# Patient Record
Sex: Female | Born: 1950
Health system: Southern US, Community
[De-identification: ages and names within clinical notes are randomized; demographics above are authoritative.]

---

## 1998-03-10 ENCOUNTER — Ambulatory Visit (HOSPITAL_COMMUNITY): Admission: RE | Admit: 1998-03-10 | Discharge: 1998-03-10 | Payer: Self-pay | Admitting: General Surgery

## 1998-09-11 ENCOUNTER — Ambulatory Visit (HOSPITAL_COMMUNITY): Admission: RE | Admit: 1998-09-11 | Discharge: 1998-09-11 | Payer: Self-pay | Admitting: General Surgery

## 1998-09-29 ENCOUNTER — Encounter (HOSPITAL_BASED_OUTPATIENT_CLINIC_OR_DEPARTMENT_OTHER): Payer: Self-pay | Admitting: General Surgery

## 1998-09-29 ENCOUNTER — Ambulatory Visit (HOSPITAL_COMMUNITY): Admission: RE | Admit: 1998-09-29 | Discharge: 1998-09-29 | Payer: Self-pay | Admitting: General Surgery

## 1999-10-08 ENCOUNTER — Encounter: Payer: Self-pay | Admitting: Family Medicine

## 1999-10-08 ENCOUNTER — Ambulatory Visit (HOSPITAL_COMMUNITY): Admission: RE | Admit: 1999-10-08 | Discharge: 1999-10-08 | Payer: Self-pay | Admitting: Family Medicine

## 1999-10-08 ENCOUNTER — Encounter (HOSPITAL_BASED_OUTPATIENT_CLINIC_OR_DEPARTMENT_OTHER): Payer: Self-pay | Admitting: General Surgery

## 1999-10-08 ENCOUNTER — Ambulatory Visit (HOSPITAL_COMMUNITY): Admission: RE | Admit: 1999-10-08 | Discharge: 1999-10-08 | Payer: Self-pay | Admitting: General Surgery

## 1999-11-04 ENCOUNTER — Other Ambulatory Visit: Admission: RE | Admit: 1999-11-04 | Discharge: 1999-11-04 | Payer: Self-pay | Admitting: Obstetrics and Gynecology

## 1999-11-04 ENCOUNTER — Encounter (INDEPENDENT_AMBULATORY_CARE_PROVIDER_SITE_OTHER): Payer: Self-pay

## 1999-12-31 ENCOUNTER — Ambulatory Visit (HOSPITAL_COMMUNITY): Admission: RE | Admit: 1999-12-31 | Discharge: 1999-12-31 | Payer: Self-pay | Admitting: Obstetrics and Gynecology

## 1999-12-31 ENCOUNTER — Encounter (INDEPENDENT_AMBULATORY_CARE_PROVIDER_SITE_OTHER): Payer: Self-pay | Admitting: Specialist

## 2001-01-18 ENCOUNTER — Encounter: Payer: Self-pay | Admitting: Obstetrics and Gynecology

## 2001-01-18 ENCOUNTER — Ambulatory Visit (HOSPITAL_COMMUNITY): Admission: RE | Admit: 2001-01-18 | Discharge: 2001-01-18 | Payer: Self-pay | Admitting: Obstetrics and Gynecology

## 2001-02-07 ENCOUNTER — Other Ambulatory Visit: Admission: RE | Admit: 2001-02-07 | Discharge: 2001-02-07 | Payer: Self-pay | Admitting: Obstetrics and Gynecology

## 2001-02-08 ENCOUNTER — Other Ambulatory Visit: Admission: RE | Admit: 2001-02-08 | Discharge: 2001-02-08 | Payer: Self-pay | Admitting: Obstetrics and Gynecology

## 2001-02-08 ENCOUNTER — Encounter (INDEPENDENT_AMBULATORY_CARE_PROVIDER_SITE_OTHER): Payer: Self-pay

## 2002-02-02 ENCOUNTER — Ambulatory Visit (HOSPITAL_COMMUNITY): Admission: RE | Admit: 2002-02-02 | Discharge: 2002-02-02 | Payer: Self-pay | Admitting: Obstetrics and Gynecology

## 2002-02-02 ENCOUNTER — Encounter: Payer: Self-pay | Admitting: Obstetrics and Gynecology

## 2002-02-09 ENCOUNTER — Other Ambulatory Visit: Admission: RE | Admit: 2002-02-09 | Discharge: 2002-02-09 | Payer: Self-pay | Admitting: Obstetrics and Gynecology

## 2008-11-18 ENCOUNTER — Ambulatory Visit (HOSPITAL_COMMUNITY): Admission: RE | Admit: 2008-11-18 | Discharge: 2008-11-18 | Payer: Self-pay | Admitting: Family Medicine

## 2019-06-01 DIAGNOSIS — Z0100 Encounter for examination of eyes and vision without abnormal findings: Secondary | ICD-10-CM | POA: Diagnosis not present

## 2019-06-04 DIAGNOSIS — R69 Illness, unspecified: Secondary | ICD-10-CM | POA: Diagnosis not present

## 2019-06-14 DIAGNOSIS — R69 Illness, unspecified: Secondary | ICD-10-CM | POA: Diagnosis not present

## 2019-08-14 DIAGNOSIS — R69 Illness, unspecified: Secondary | ICD-10-CM | POA: Diagnosis not present

## 2020-02-27 DIAGNOSIS — R69 Illness, unspecified: Secondary | ICD-10-CM | POA: Diagnosis not present

## 2020-03-03 DIAGNOSIS — R69 Illness, unspecified: Secondary | ICD-10-CM | POA: Diagnosis not present

## 2020-06-13 DIAGNOSIS — Z0100 Encounter for examination of eyes and vision without abnormal findings: Secondary | ICD-10-CM | POA: Diagnosis not present

## 2020-08-18 ENCOUNTER — Ambulatory Visit (HOSPITAL_COMMUNITY)
Admission: RE | Admit: 2020-08-18 | Discharge: 2020-08-18 | Disposition: A | Discharge status: Home / Self Care | Payer: Medicare HMO | Source: Ambulatory Visit | Attending: Family Medicine | Admitting: Family Medicine

## 2020-08-18 ENCOUNTER — Other Ambulatory Visit (HOSPITAL_COMMUNITY): Payer: Self-pay | Admitting: Family Medicine

## 2020-08-18 ENCOUNTER — Other Ambulatory Visit: Payer: Self-pay

## 2020-08-18 DIAGNOSIS — M479 Spondylosis, unspecified: Secondary | ICD-10-CM | POA: Diagnosis not present

## 2020-08-18 DIAGNOSIS — D510 Vitamin B12 deficiency anemia due to intrinsic factor deficiency: Secondary | ICD-10-CM | POA: Diagnosis not present

## 2020-08-18 DIAGNOSIS — R5383 Other fatigue: Secondary | ICD-10-CM | POA: Diagnosis not present

## 2020-08-18 DIAGNOSIS — E559 Vitamin D deficiency, unspecified: Secondary | ICD-10-CM | POA: Diagnosis not present

## 2020-08-18 DIAGNOSIS — I110 Hypertensive heart disease with heart failure: Secondary | ICD-10-CM | POA: Diagnosis not present

## 2020-08-18 DIAGNOSIS — E7849 Other hyperlipidemia: Secondary | ICD-10-CM | POA: Diagnosis not present

## 2020-08-18 DIAGNOSIS — Z20822 Contact with and (suspected) exposure to covid-19: Secondary | ICD-10-CM | POA: Diagnosis not present

## 2020-08-18 DIAGNOSIS — E1165 Type 2 diabetes mellitus with hyperglycemia: Secondary | ICD-10-CM | POA: Diagnosis not present

## 2020-08-18 DIAGNOSIS — M47812 Spondylosis without myelopathy or radiculopathy, cervical region: Secondary | ICD-10-CM | POA: Diagnosis not present

## 2020-08-23 ENCOUNTER — Ambulatory Visit
Admission: EM | Admit: 2020-08-23 | Discharge: 2020-08-23 | Disposition: A | Discharge status: Home / Self Care | Payer: Medicare HMO | Attending: Emergency Medicine | Admitting: Emergency Medicine

## 2020-08-23 ENCOUNTER — Other Ambulatory Visit: Payer: Self-pay

## 2020-08-23 ENCOUNTER — Encounter: Payer: Self-pay | Admitting: Emergency Medicine

## 2020-08-23 ENCOUNTER — Ambulatory Visit (INDEPENDENT_AMBULATORY_CARE_PROVIDER_SITE_OTHER): Payer: Medicare HMO

## 2020-08-23 DIAGNOSIS — R059 Cough, unspecified: Secondary | ICD-10-CM

## 2020-08-23 DIAGNOSIS — Z20822 Contact with and (suspected) exposure to covid-19: Secondary | ICD-10-CM

## 2020-08-23 DIAGNOSIS — J189 Pneumonia, unspecified organism: Secondary | ICD-10-CM | POA: Diagnosis not present

## 2020-08-23 MED ORDER — AZITHROMYCIN 250 MG PO TABS: 250.0000 mg | ORAL_TABLET | Freq: Every day | ORAL | 0 refills | Status: DC

## 2020-08-23 MED ORDER — ACETAMINOPHEN 325 MG PO TABS
650.0000 mg | ORAL_TABLET | Freq: Once | ORAL | Status: AC
  Administered 2020-08-23: 650 mg via ORAL

## 2020-08-23 MED ORDER — AMOXICILLIN-POT CLAVULANATE 875-125 MG PO TABS: 1.0000 | ORAL_TABLET | Freq: Two times a day (BID) | ORAL | 0 refills | Status: DC

## 2020-08-23 NOTE — ED Provider Notes (Signed)
[facility]    CSN: [id number] Arrival date & time: 08/23/20  0086      History   Chief Complaint Chief Complaint  Patient presents with  . Weakness  . Cough    HPI Sabrina [name] is a 69 y.o. female  Presenting with her daughter for weeklong course of generalized weakness, productive cough, decreased appetite.  Has had low-grade fevers intermittently: Alleviated with Tylenol.  States sputum is clear/white.  No chest pain, shortness of breath, lower leg swelling, nausea, vomiting.  No known sick contacts.  Did do Covid test on day 2 of symptoms: Negative.  History reviewed. No pertinent past medical history.  There are no problems to display for this patient.   History reviewed. No pertinent surgical history.  OB History   No obstetric history on file.      Home Medications    Prior to Admission medications   Medication Sig Start Date End Date Taking? Authorizing Provider  amoxicillin-clavulanate (AUGMENTIN) 875-125 MG tablet Take 1 tablet by mouth every 12 (twelve) hours. 08/23/20   Hall-Potvin, [place], PA-C  azithromycin (ZITHROMAX) 250 MG tablet Take 1 tablet (250 mg total) by mouth daily. Take first 2 tablets together, then 1 every day until finished. 08/23/20   Hall-Potvin, [place], PA-C    Family History Family History  Problem Relation Age of Onset  . Hypertension Mother     Social History Social History   Tobacco Use  . Smoking status: Never Smoker  . Smokeless tobacco: Never Used  Substance Use Topics  . Alcohol use: Not Currently  . Drug use: Never     Allergies   Patient has no known allergies.   Review of Systems As per HPI   Physical Exam Triage Vital Signs ED Triage Vitals  Enc Vitals Group     BP      Pulse      Resp      Temp      Temp src      SpO2      Weight      Height      Head Circumference      Peak Flow      Pain Score      Pain Loc      Pain Edu?      Excl. in GC?    No data  found.  Updated Vital Signs BP 115/65 (BP Location: Left Arm)   Pulse (!) 111   Temp 100.2 F (37.9 C) (Oral)   Resp 18   SpO2 96%   Visual Acuity Right Eye Distance:   Left Eye Distance:   Bilateral Distance:    Right Eye Near:   Left Eye Near:    Bilateral Near:     Physical Exam Constitutional:      General: She is not in acute distress.    Appearance: She is not ill-appearing or diaphoretic.  HENT:     Head: Normocephalic and atraumatic.     Mouth/Throat:     Mouth: Mucous membranes are moist.     Pharynx: Oropharynx is clear. No oropharyngeal exudate or posterior oropharyngeal erythema.  Eyes:     General: No scleral icterus.    Conjunctiva/sclera: Conjunctivae normal.     Pupils: Pupils are equal, round, and reactive to light.  Neck:     Comments: Trachea midline, negative JVD Cardiovascular:     Rate and Rhythm: Normal rate and regular rhythm.     Heart sounds: No murmur  heard.  No gallop.   Pulmonary:     Effort: Pulmonary effort is normal. No respiratory distress.     Breath sounds: No wheezing, rhonchi or rales.  Musculoskeletal:     Cervical back: Neck supple. No tenderness.  Lymphadenopathy:     Cervical: No cervical adenopathy.  Skin:    Capillary Refill: Capillary refill takes less than 2 seconds.     Coloration: Skin is not jaundiced or pale.     Findings: No rash.  Neurological:     General: No focal deficit present.     Mental Status: She is alert and oriented to person, place, and time.      UC Treatments / Results  Labs (all labs ordered are listed, but only abnormal results are displayed) Labs Reviewed  NOVEL CORONAVIRUS, NAA    EKG   Radiology DG Chest 2 View  Result Date: 08/23/2020 CLINICAL DATA:  Cough EXAM: CHEST - 2 VIEW COMPARISON:  None. FINDINGS: Streaky infiltrate at the left lung base. Inferior right hilar fullness which could be branching and vascular but is also abnormally prominent on the lateral view. Preferably,  would allow for resolution of the acute illness before workup imaging. No edema, effusion, or pneumothorax. IMPRESSION: 1. Infiltrate at the left lung base. 2. Unexpected fullness at the right hilum. 3. Followup PA and lateral chest X-ray or CT is recommended in 3-4 weeks following trial of antibiotic therapy to ensure resolution and exclude underlying malignancy. Electronically Signed   By: Marnee Spring M.D.   On: 08/23/2020 09:33    Procedures Procedures (including critical care time)  Medications Ordered in UC Medications  acetaminophen (TYLENOL) tablet 650 mg (650 mg Oral Given 08/23/20 0856)    Initial Impression / Assessment and Plan / UC Course  I have reviewed the triage vital signs and the nursing notes.  Pertinent labs & imaging results that were available during my care of the patient were reviewed by me and considered in my medical decision making (see chart for details).     Cardiopulmonary exam reassuring today, though patient does have low-grade fever with tachycardia and general malaise with decreased appetite.  Chest x-ray done to screen for pneumonia: Significant for infiltrate at left lung base with unexpected fullness at the right hilum.  Will start antibiotics as below, have patient follow-up with PCP in 3 weeks for repeat imaging as recommended by radiology.  Return precautions discussed, pt and daughter verbalized understanding and are agreeable to plan. Final Clinical Impressions(s) / UC Diagnoses   Final diagnoses:  Encounter for screening laboratory testing for COVID-19 virus  Pneumonia of left lower lobe due to infectious organism     Discharge Instructions     Take antibiotics as directed with food. Very important to schedule follow-up with PCP for reevaluation. Go to ER for worsening fatigue/weakness, or you develop high fevers, chest pain, difficulty breathing.    ED Prescriptions    Medication Sig Dispense Auth. Provider   azithromycin  (ZITHROMAX) 250 MG tablet Take 1 tablet (250 mg total) by mouth daily. Take first 2 tablets together, then 1 every day until finished. 6 tablet Hall-Potvin, [place], PA-C   amoxicillin-clavulanate (AUGMENTIN) 875-125 MG tablet Take 1 tablet by mouth every 12 (twelve) hours. 14 tablet Hall-Potvin, [place], PA-C     PDMP not reviewed this encounter.   Odette Fraction [place], [place] 08/23/20 [phone]

## 2020-08-23 NOTE — ED Triage Notes (Signed)
Pt here for generalized weakness, cough and decreased appetite x 1 week; per pt sts low grade fever earlier in week

## 2020-08-23 NOTE — Discharge Instructions (Signed)
Take antibiotics as directed with food. Very important to schedule follow-up with PCP for reevaluation. Go to ER for worsening fatigue/weakness, or you develop high fevers, chest pain, difficulty breathing.

## 2020-08-24 LAB — SARS-COV-2, NAA 2 DAY TAT

## 2020-08-24 LAB — NOVEL CORONAVIRUS, NAA: SARS-CoV-2, NAA: NOT DETECTED

## 2020-09-22 DIAGNOSIS — R69 Illness, unspecified: Secondary | ICD-10-CM | POA: Diagnosis not present

## 2021-01-16 ENCOUNTER — Ambulatory Visit (INDEPENDENT_AMBULATORY_CARE_PROVIDER_SITE_OTHER): Payer: Medicare HMO | Admitting: Family Medicine

## 2021-01-16 ENCOUNTER — Encounter: Payer: Self-pay | Admitting: Family Medicine

## 2021-01-16 ENCOUNTER — Other Ambulatory Visit: Payer: Self-pay

## 2021-01-16 VITALS — BP 172/90 | HR 78 | Temp 98.4°F | Ht 62.0 in | Wt 166.4 lb

## 2021-01-16 DIAGNOSIS — R739 Hyperglycemia, unspecified: Secondary | ICD-10-CM | POA: Diagnosis not present

## 2021-01-16 DIAGNOSIS — R748 Abnormal levels of other serum enzymes: Secondary | ICD-10-CM

## 2021-01-16 DIAGNOSIS — R030 Elevated blood-pressure reading, without diagnosis of hypertension: Secondary | ICD-10-CM

## 2021-01-16 DIAGNOSIS — Z1231 Encounter for screening mammogram for malignant neoplasm of breast: Secondary | ICD-10-CM | POA: Diagnosis not present

## 2021-01-16 DIAGNOSIS — M5030 Other cervical disc degeneration, unspecified cervical region: Secondary | ICD-10-CM

## 2021-01-16 DIAGNOSIS — Z1159 Encounter for screening for other viral diseases: Secondary | ICD-10-CM | POA: Diagnosis not present

## 2021-01-16 LAB — CBC WITH DIFFERENTIAL/PLATELET
Basophils Absolute: 0 10*3/uL (ref 0.0–0.1)
Basophils Relative: 0.3 % (ref 0.0–3.0)
Eosinophils Absolute: 0.1 10*3/uL (ref 0.0–0.7)
Eosinophils Relative: 1.3 % (ref 0.0–5.0)
HCT: 39.8 % (ref 36.0–46.0)
Hemoglobin: 12.7 g/dL (ref 12.0–15.0)
Lymphocytes Relative: 23.8 % (ref 12.0–46.0)
Lymphs Abs: 1 10*3/uL (ref 0.7–4.0)
MCHC: 32 g/dL (ref 30.0–36.0)
MCV: 66.3 fl — ABNORMAL LOW (ref 78.0–100.0)
Monocytes Absolute: 0.3 10*3/uL (ref 0.1–1.0)
Monocytes Relative: 7.4 % (ref 3.0–12.0)
Neutro Abs: 2.7 10*3/uL (ref 1.4–7.7)
Neutrophils Relative %: 67.2 % (ref 43.0–77.0)
Platelets: 234 10*3/uL (ref 150.0–400.0)
RBC: 6 Mil/uL — ABNORMAL HIGH (ref 3.87–5.11)
RDW: 14.9 % (ref 11.5–15.5)
WBC: 4.1 10*3/uL (ref 4.0–10.5)

## 2021-01-16 LAB — COMPREHENSIVE METABOLIC PANEL
ALT: 14 U/L (ref 0–35)
AST: 21 U/L (ref 0–37)
Albumin: 4.4 g/dL (ref 3.5–5.2)
Alkaline Phosphatase: 82 U/L (ref 39–117)
BUN: 12 mg/dL (ref 6–23)
CO2: 31 mEq/L (ref 19–32)
Calcium: 10.2 mg/dL (ref 8.4–10.5)
Chloride: 102 mEq/L (ref 96–112)
Creatinine, Ser: 0.67 mg/dL (ref 0.40–1.20)
GFR: 87.6 mL/min (ref >60.00)
Glucose, Bld: 95 mg/dL (ref 70–99)
Potassium: 5.2 mEq/L — ABNORMAL HIGH (ref 3.5–5.1)
Sodium: 141 mEq/L (ref 135–145)
Total Bilirubin: 0.9 mg/dL (ref 0.2–1.2)
Total Protein: 7.4 g/dL (ref 6.0–8.3)

## 2021-01-16 LAB — HEMOGLOBIN A1C: Hgb A1c MFr Bld: 5.6 % (ref 4.6–6.5)

## 2021-01-16 NOTE — Patient Instructions (Addendum)
-pneumovax 23: look this up. It protects against a pneumonia that has a high mortality rate. Need one and no more.   -rechecking some labs today from the ones you brought in  -ordered mammogram for you, they will call you with this.   -blood pressure is high, start keeping a log and see me back in one month.    -cut out salt, push water and keep a log for me.   So nice to meet you! Dr. Artis Flock   Hypertension, Adult Hypertension is another name for high blood pressure. High blood pressure forces your heart to work harder to pump blood. This can cause problems over time. There are two numbers in a blood pressure reading. There is a top number (systolic) over a bottom number (diastolic). It is best to have a blood pressure that is below 120/80. Healthy choices can help lower your blood pressure, or you may need medicine to help lower it. What are the causes? The cause of this condition is not known. Some conditions may be related to high blood pressure. What increases the risk?  Smoking.  Having type 2 diabetes mellitus, high cholesterol, or both.  Not getting enough exercise or physical activity.  Being overweight.  Having too much fat, sugar, calories, or salt (sodium) in your diet.  Drinking too much alcohol.  Having long-term (chronic) kidney disease.  Having a family history of high blood pressure.  Age. Risk increases with age.  Race. You may be at higher risk if you are African American.  Gender. Men are at higher risk than women before age 71. After age 98, women are at higher risk than men.  Having obstructive sleep apnea.  Stress. What are the signs or symptoms?  High blood pressure may not cause symptoms. Very high blood pressure (hypertensive crisis) may cause: ? Headache. ? Feelings of worry or nervousness (anxiety). ? Shortness of breath. ? Nosebleed. ? A feeling of being sick to your stomach (nausea). ? Throwing up (vomiting). ? Changes in how you  see. ? Very bad chest pain. ? Seizures. How is this treated?  This condition is treated by making healthy lifestyle changes, such as: ? Eating healthy foods. ? Exercising more. ? Drinking less alcohol.  Your health care provider may prescribe medicine if lifestyle changes are not enough to get your blood pressure under control, and if: ? Your top number is above 130. ? Your bottom number is above 80.  Your personal target blood pressure may vary. Follow these instructions at home: Eating and drinking  If told, follow the DASH eating plan. To follow this plan: ? Fill one half of your plate at each meal with fruits and vegetables. ? Fill one fourth of your plate at each meal with whole grains. Whole grains include whole-wheat pasta, brown rice, and whole-grain bread. ? Eat or drink low-fat dairy products, such as skim milk or low-fat yogurt. ? Fill one fourth of your plate at each meal with low-fat (lean) proteins. Low-fat proteins include fish, chicken without skin, eggs, beans, and tofu. ? Avoid fatty meat, cured and processed meat, or chicken with skin. ? Avoid pre-made or processed food.  Eat less than 1,500 mg of salt each day.  Do not drink alcohol if: ? Your doctor tells you not to drink. ? You are pregnant, may be pregnant, or are planning to become pregnant.  If you drink alcohol: ? Limit how much you use to:  0-1 drink a day for women.  0-2 drinks a day for men. ? Be aware of how much alcohol is in your drink. In the [place] one drink equals one 12 oz bottle of beer (355 mL), one 5 oz glass of wine (148 mL), or one 1 oz glass of hard liquor (44 mL).   Lifestyle  Work with your doctor to stay at a healthy weight or to lose weight. Ask your doctor what the best weight is for you.  Get at least 30 minutes of exercise most days of the week. This may include walking, swimming, or biking.  Get at least 30 minutes of exercise that strengthens your muscles (resistance  exercise) at least 3 days a week. This may include lifting weights or doing Pilates.  Do not use any products that contain nicotine or tobacco, such as cigarettes, e-cigarettes, and chewing tobacco. If you need help quitting, ask your doctor.  Check your blood pressure at home as told by your doctor.  Keep all follow-up visits as told by your doctor. This is important.   Medicines  Take over-the-counter and prescription medicines only as told by your doctor. Follow directions carefully.  Do not skip doses of blood pressure medicine. The medicine does not work as well if you skip doses. Skipping doses also puts you at risk for problems.  Ask your doctor about side effects or reactions to medicines that you should watch for. Contact a doctor if you:  Think you are having a reaction to the medicine you are taking.  Have headaches that keep coming back (recurring).  Feel dizzy.  Have swelling in your ankles.  Have trouble with your vision. Get help right away if you:  Get a very bad headache.  Start to feel mixed up (confused).  Feel weak or numb.  Feel faint.  Have very bad pain in your: ? Chest. ? Belly (abdomen).  Throw up more than once.  Have trouble breathing. Summary  Hypertension is another name for high blood pressure.  High blood pressure forces your heart to work harder to pump blood.  For most people, a normal blood pressure is less than 120/80.  Making healthy choices can help lower blood pressure. If your blood pressure does not get lower with healthy choices, you may need to take medicine. This information is not intended to replace advice given to you by your health care provider. Make sure you discuss any questions you have with your health care provider. Document Revised: 06/28/2018 Document Reviewed: 06/28/2018 Elsevier Patient Education  2021 [employer].

## 2021-01-16 NOTE — Progress Notes (Signed)
Patient: Sabrina [name] MRN: [id number] DOB: [birth year] PCP: Orland Mustard, MD     Subjective:  Chief Complaint  Patient presents with   Establish Care   Insomnia    HPI: The patient is a 70 y.o. female who presents today to establish care. She has no medical history that she is aware of. She is on no medication except a multivitamin. She does have some labs from 08/2020 including a cbc, cmp, lipid panel, tsh and cervical spine xray. She has complaints today of insomnia, specifically falling asleep. She does watch TV in the bed. She also drinks caffienated tea after 2pm. She does exercise. She typically will fall asleep at 10pm and then wakes up at 2am and is wide awake until 4:30am. She will go back to sleep for a bout an hour.   She has no family history of colon or breast cancer in family. Her maternal grandmother lived to be 74 and her mother was 29 years of age when they passed away.   -she has never had a colonoscopy -she needs a mmg.  -discussed dexa, but doesn't want to do this.  -pneumonia shot not done.    Review of Systems  Constitutional: Negative for chills, fatigue and fever.  HENT: Negative for dental problem, ear pain, hearing loss and trouble swallowing.   Eyes: Negative for visual disturbance.  Respiratory: Negative for cough, chest tightness and shortness of breath.   Cardiovascular: Negative for chest pain, palpitations and leg swelling.  Gastrointestinal: Negative for abdominal pain, blood in stool, diarrhea and nausea.  Endocrine: Negative for cold intolerance, polydipsia, polyphagia and polyuria.  Genitourinary: Negative for dysuria and hematuria.  Musculoskeletal: Negative for arthralgias.  Skin: Negative for rash.  Neurological: Negative for dizziness and headaches.  Psychiatric/Behavioral: Positive for sleep disturbance. Negative for dysphoric mood. The patient is not nervous/anxious.     Allergies Patient has No Known Allergies.  Past  Medical History Patient  has no past medical history on file.  Surgical History Patient  has no past surgical history on file.  Family History Pateint's family history includes Hypertension in her mother.  Social History Patient  reports that she has quit smoking. She has never used smokeless tobacco. She reports previous alcohol use. She reports that she does not use drugs.    Objective: Vitals:   01/16/21 0955 01/16/21 1027  BP: (!) 148/88 (!) 172/90  Pulse: 78   Temp: 98.4 F (36.9 C)   TempSrc: Temporal   SpO2: 98%   Weight: 166 lb 6.4 oz (75.5 kg)   Height: 5\' 2"  (1.575 m)     Body mass index is 30.43 kg/m.  Physical Exam Vitals reviewed.  Constitutional:      Appearance: Normal appearance. She is well-developed and normal weight.  HENT:     Head: Normocephalic and atraumatic.     Right Ear: Tympanic membrane, ear canal and external ear normal.     Left Ear: Tympanic membrane, ear canal and external ear normal.  Eyes:     Conjunctiva/sclera: Conjunctivae normal.     Pupils: Pupils are equal, round, and reactive to light.  Neck:     Thyroid: No thyromegaly.     Vascular: No carotid bruit.  Cardiovascular:     Rate and Rhythm: Normal rate and regular rhythm.     Pulses: Normal pulses.     Heart sounds: Normal heart sounds. No murmur heard.   Pulmonary:     Effort: Pulmonary effort is normal.  Breath sounds: Normal breath sounds.  Abdominal:     General: Bowel sounds are normal. There is no distension.     Palpations: Abdomen is soft.     Tenderness: There is no abdominal tenderness.  Musculoskeletal:     Cervical back: Normal range of motion and neck supple.  Lymphadenopathy:     Cervical: No cervical adenopathy.  Skin:    General: Skin is warm and dry.     Capillary Refill: Capillary refill takes less than 2 seconds.     Findings: No rash.  Neurological:     General: No focal deficit present.     Mental Status: She is alert and oriented to  person, place, and time.     Cranial Nerves: No cranial nerve deficit.     Coordination: Coordination normal.     Deep Tendon Reflexes: Reflexes normal.  Psychiatric:        Mood and Affect: Mood normal.        Behavior: Behavior normal.    Flowsheet Row Office Visit from 01/16/2021 in [facility]  PHQ-2 Total Score 0          Assessment/plan: 1. Elevated liver enzymes All her labs reviewed. Will repeat these today.  - CBC with Differential/Platelet - Comprehensive metabolic panel  2. Elevated blood sugar  - Hemoglobin A1c  3. Degenerative disc disease, cervical No pain and no complaints today.   4. Screening mammogram for breast cancer  - MM Digital Screening; Future  5. Encounter for hepatitis C screening test for low risk patient  - Hepatitis C antibody  6. Elevated blood pressure reading No hx of HTN, but does have a family history. She has not been on medication. She is very anxious today. Im going to have her work on diet, cutting out salt, exercise and keep a log. She will have close f/u with me in one month or sooner if needed. Precautions given.   7. Insomnia -went over sleep hygiene in detail. Will stop sceen time before bed. No caffiene after 2pm.  -discussed natural therapy with melatonin, laveder oil, ashwagandha. Discussed higher dose melatonin can cause nightmares.  -if this fails, trial trazodone.    This visit occurred during the SARS-CoV-2 public health emergency.  Safety protocols were in place, including screening questions prior to the visit, additional usage of staff PPE, and extensive cleaning of exam room while observing appropriate contact time as indicated for disinfecting solutions.     Return in about 1 month (around 02/16/2021) for blood pressure check up .   Orland Mustard, MD [facility]   01/16/2021

## 2021-01-19 LAB — HEPATITIS C ANTIBODY
Hepatitis C Ab: NONREACTIVE
SIGNAL TO CUT-OFF: 0.01 (ref <1.00)

## 2021-02-16 ENCOUNTER — Other Ambulatory Visit: Payer: Self-pay

## 2021-02-16 ENCOUNTER — Encounter: Payer: Self-pay | Admitting: Family Medicine

## 2021-02-16 ENCOUNTER — Ambulatory Visit (INDEPENDENT_AMBULATORY_CARE_PROVIDER_SITE_OTHER): Payer: Medicare HMO | Admitting: Family Medicine

## 2021-02-16 VITALS — BP 180/90 | HR 81 | Temp 98.6°F | Ht 62.0 in | Wt 166.0 lb

## 2021-02-16 DIAGNOSIS — I10 Essential (primary) hypertension: Secondary | ICD-10-CM

## 2021-02-16 DIAGNOSIS — E875 Hyperkalemia: Secondary | ICD-10-CM | POA: Diagnosis not present

## 2021-02-16 DIAGNOSIS — R718 Other abnormality of red blood cells: Secondary | ICD-10-CM

## 2021-02-16 LAB — MICROALBUMIN / CREATININE URINE RATIO
Creatinine,U: 98.4 mg/dL
Microalb Creat Ratio: 0.8 mg/g (ref 0.0–30.0)
Microalb, Ur: 0.8 mg/dL (ref 0.0–1.9)

## 2021-02-16 LAB — BASIC METABOLIC PANEL
BUN: 12 mg/dL (ref 6–23)
CO2: 27 mEq/L (ref 19–32)
Calcium: 9.8 mg/dL (ref 8.4–10.5)
Chloride: 103 mEq/L (ref 96–112)
Creatinine, Ser: 0.65 mg/dL (ref 0.40–1.20)
GFR: 88.18 mL/min (ref >60.00)
Glucose, Bld: 85 mg/dL (ref 70–99)
Potassium: 3.6 mEq/L (ref 3.5–5.1)
Sodium: 139 mEq/L (ref 135–145)

## 2021-02-16 LAB — IRON,TIBC AND FERRITIN PANEL
%SAT: 22 % (calc) (ref 16–45)
Ferritin: 86 ng/mL (ref 16–288)
Iron: 73 ug/dL (ref 45–160)
TIBC: 335 mcg/dL (calc) (ref 250–450)

## 2021-02-16 MED ORDER — AMLODIPINE BESYLATE 5 MG PO TABS: 5.0000 mg | ORAL_TABLET | Freq: Every day | ORAL | 1 refills | Status: DC

## 2021-02-16 NOTE — Progress Notes (Signed)
Patient: Sabrina [name] MRN: [id number] DOB: 11-Mar-1951 PCP: Orland Mustard, MD     Subjective:  Chief Complaint  Patient presents with  . Hypertension    She would like to discuss taking something, because she says that she is worrying too much about bring it down by herself.     HPI: The patient is a 70 y.o. female who presents today for  HTN. She says that she is working on diet and exercise. She feels as if she is worrying too much about her BP, that's why she thinks it is up. She would like to discuss getting on something.  I saw her one month ago for a NP appointment. She was quite hypertensive at that time, but she thought it was more due to nerves and wanted to work on conservative measures. I gave her a month and she returns today to follow up on this. She has her home readings with her and average 168-170/95. She has done everything including exercise, sleeping good, cutting out salt. She is frustrated!   She is a member of [employer] and is doing a fitness assessment.   microcytosis without anemia -no known family hx of sickle cell or thalassemia -past hx of ida with pregnancy   Review of Systems  Constitutional: Negative for chills, fatigue and fever.  HENT: Negative for dental problem, ear pain, hearing loss and trouble swallowing.   Eyes: Negative for visual disturbance.  Respiratory: Negative for cough, chest tightness and shortness of breath.   Cardiovascular: Negative for chest pain, palpitations and leg swelling.  Gastrointestinal: Negative for abdominal pain, blood in stool, diarrhea and nausea.  Endocrine: Negative for cold intolerance, polydipsia, polyphagia and polyuria.  Genitourinary: Negative for dysuria and hematuria.  Musculoskeletal: Negative for arthralgias.  Skin: Negative for rash.  Neurological: Negative for dizziness and headaches.  Psychiatric/Behavioral: Negative for dysphoric mood and sleep disturbance. The patient is not nervous/anxious.      Allergies Patient has No Known Allergies.  Past Medical History Patient  has no past medical history on file.  Surgical History Patient  has no past surgical history on file.  Family History Pateint's family history includes Hypertension in her mother.  Social History Patient  reports that she has quit smoking. She has never used smokeless tobacco. She reports previous alcohol use. She reports that she does not use drugs.    Objective: Vitals:   02/16/21 1019 02/16/21 1049  BP: (!) 180/100 (!) 180/90  Pulse: 81   Temp: 98.6 F (37 C)   TempSrc: Temporal   SpO2: 99%   Weight: 166 lb (75.3 kg)   Height: 5\' 2"  (1.575 m)     Body mass index is 30.36 kg/m.  Physical Exam Vitals reviewed.  Constitutional:      Appearance: Normal appearance. She is well-developed and normal weight.  HENT:     Head: Normocephalic and atraumatic.     Right Ear: Tympanic membrane, ear canal and external ear normal.     Left Ear: Tympanic membrane, ear canal and external ear normal.  Eyes:     Conjunctiva/sclera: Conjunctivae normal.     Pupils: Pupils are equal, round, and reactive to light.  Neck:     Thyroid: No thyromegaly.  Cardiovascular:     Rate and Rhythm: Normal rate and regular rhythm.     Heart sounds: Murmur heard.    Pulmonary:     Effort: Pulmonary effort is normal.     Breath sounds: Normal breath sounds.  Abdominal:  General: Bowel sounds are normal. There is no distension.     Palpations: Abdomen is soft.     Tenderness: There is no abdominal tenderness.  Musculoskeletal:     Cervical back: Normal range of motion and neck supple.  Lymphadenopathy:     Cervical: No cervical adenopathy.  Skin:    General: Skin is warm and dry.     Capillary Refill: Capillary refill takes less than 2 seconds.     Findings: No rash.  Neurological:     General: No focal deficit present.     Mental Status: She is alert and oriented to person, place, and time.     Cranial  Nerves: No cranial nerve deficit.     Coordination: Coordination normal.     Deep Tendon Reflexes: Reflexes normal.  Psychiatric:        Mood and Affect: Mood normal.        Behavior: Behavior normal.        EKG: NSR with rate of 65. LAE  Assessment/plan: 1. Benign essential HTN Above goal as I suspected. Starting norvasc  At 5mg . Discussed slow taper down to goal blood pressure, will likely need an increase of dosage. Will see me back in a month for follow up with bp log. Side effects of medication discussed. Baseline ekg and up/uc today. F/u in one month.  - Microalbumin / creatinine urine ratio - EKG 12-Lead  2. RBC microcytosis -iron panel and hgb electrophoresis r/o IDA vs. Thalassemia  - Iron, TIBC and Ferritin Panel  3. Hyperkalemia Repeat labs - Basic metabolic panel   Return in about 1 month (around 03/18/2021) for [facility] for blood pressure .   [facility], MD [facility]   02/16/2021

## 2021-02-16 NOTE — Patient Instructions (Signed)
Starting you on a medication called norvasc (amlodipine). You will take this once a day in the AM for blood pressure. Doing a 5mg  dosage. Will see you back in 1 month for recheck as we may need to increase this. Want to slowly bring your blood pressure down, but eventually goal will be <140/90.   Have a wonderful week! See you in 1 month, Dr.   Hypertension, Adult Hypertension is another name for high blood pressure. High blood pressure forces your heart to work harder to pump blood. This can cause problems over time. There are two numbers in a blood pressure reading. There is a top number (systolic) over a bottom number (diastolic). It is best to have a blood pressure that is below 120/80. Healthy choices can help lower your blood pressure, or you may need medicine to help lower it. What are the causes? The cause of this condition is not known. Some conditions may be related to high blood pressure. What increases the risk?  Smoking.  Having type 2 diabetes mellitus, high cholesterol, or both.  Not getting enough exercise or physical activity.  Being overweight.  Having too much fat, sugar, calories, or salt (sodium) in your diet.  Drinking too much alcohol.  Having long-term (chronic) kidney disease.  Having a family history of high blood pressure.  Age. Risk increases with age.  Race. You may be at higher risk if you are African American.  Gender. Men are at higher risk than women before age 61. After age 36, women are at higher risk than men.  Having obstructive sleep apnea.  Stress. What are the signs or symptoms?  High blood pressure may not cause symptoms. Very high blood pressure (hypertensive crisis) may cause: ? Headache. ? Feelings of worry or nervousness (anxiety). ? Shortness of breath. ? Nosebleed. ? A feeling of being sick to your stomach (nausea). ? Throwing up (vomiting). ? Changes in how you see. ? Very bad chest pain. ? Seizures. How is this  treated?  This condition is treated by making healthy lifestyle changes, such as: ? Eating healthy foods. ? Exercising more. ? Drinking less alcohol.  Your health care provider may prescribe medicine if lifestyle changes are not enough to get your blood pressure under control, and if: ? Your top number is above 130. ? Your bottom number is above 80.  Your personal target blood pressure may vary. Follow these instructions at home: Eating and drinking  If told, follow the DASH eating plan. To follow this plan: ? Fill one half of your plate at each meal with fruits and vegetables. ? Fill one fourth of your plate at each meal with whole grains. Whole grains include whole-wheat pasta, brown rice, and whole-grain bread. ? Eat or drink low-fat dairy products, such as skim milk or low-fat yogurt. ? Fill one fourth of your plate at each meal with low-fat (lean) proteins. Low-fat proteins include fish, chicken without skin, eggs, beans, and tofu. ? Avoid fatty meat, cured and processed meat, or chicken with skin. ? Avoid pre-made or processed food.  Eat less than 1,500 mg of salt each day.  Do not drink alcohol if: ? Your doctor tells you not to drink. ? You are pregnant, may be pregnant, or are planning to become pregnant.  If you drink alcohol: ? Limit how much you use to:  0-1 drink a day for women.  0-2 drinks a day for men. ? Be aware of how much alcohol is in your  drink. In the [place] one drink equals one 12 oz bottle of beer (355 mL), one 5 oz glass of wine (148 mL), or one 1 oz glass of hard liquor (44 mL).   Lifestyle  Work with your doctor to stay at a healthy weight or to lose weight. Ask your doctor what the best weight is for you.  Get at least 30 minutes of exercise most days of the week. This may include walking, swimming, or biking.  Get at least 30 minutes of exercise that strengthens your muscles (resistance exercise) at least 3 days a week. This may include lifting  weights or doing Pilates.  Do not use any products that contain nicotine or tobacco, such as cigarettes, e-cigarettes, and chewing tobacco. If you need help quitting, ask your doctor.  Check your blood pressure at home as told by your doctor.  Keep all follow-up visits as told by your doctor. This is important.   Medicines  Take over-the-counter and prescription medicines only as told by your doctor. Follow directions carefully.  Do not skip doses of blood pressure medicine. The medicine does not work as well if you skip doses. Skipping doses also puts you at risk for problems.  Ask your doctor about side effects or reactions to medicines that you should watch for. Contact a doctor if you:  Think you are having a reaction to the medicine you are taking.  Have headaches that keep coming back (recurring).  Feel dizzy.  Have swelling in your ankles.  Have trouble with your vision. Get help right away if you:  Get a very bad headache.  Start to feel mixed up (confused).  Feel weak or numb.  Feel faint.  Have very bad pain in your: ? Chest. ? Belly (abdomen).  Throw up more than once.  Have trouble breathing. Summary  Hypertension is another name for high blood pressure.  High blood pressure forces your heart to work harder to pump blood.  For most people, a normal blood pressure is less than 120/80.  Making healthy choices can help lower blood pressure. If your blood pressure does not get lower with healthy choices, you may need to take medicine. This information is not intended to replace advice given to you by your health care provider. Make sure you discuss any questions you have with your health care provider. Document Revised: 06/28/2018 Document Reviewed: 06/28/2018 Elsevier Patient Education  2021 [employer].

## 2021-02-18 ENCOUNTER — Other Ambulatory Visit: Payer: Medicare HMO

## 2021-02-18 DIAGNOSIS — R718 Other abnormality of red blood cells: Secondary | ICD-10-CM

## 2021-03-02 ENCOUNTER — Ambulatory Visit: Payer: Medicare HMO | Attending: Internal Medicine

## 2021-03-02 ENCOUNTER — Other Ambulatory Visit (HOSPITAL_BASED_OUTPATIENT_CLINIC_OR_DEPARTMENT_OTHER): Payer: Self-pay

## 2021-03-02 ENCOUNTER — Other Ambulatory Visit: Payer: Self-pay

## 2021-03-02 DIAGNOSIS — Z23 Encounter for immunization: Secondary | ICD-10-CM

## 2021-03-02 NOTE — Progress Notes (Signed)
   Covid-19 Vaccination Clinic  Name:  Sabrina [name]    MRN: [id number] DOB: 06-16-1951  03/02/2021  [name] was observed post Covid-19 immunization for 15 minutes without incident. She was provided with Vaccine Information Sheet and instruction to access the V-Safe system.   [name] was instructed to call 911 with any severe reactions post vaccine: Marland Kitchen Difficulty breathing  . Swelling of face and throat  . A fast heartbeat  . A bad rash all over body  . Dizziness and weakness   Immunizations Administered    Name Date Dose VIS Date Route   PFIZER Comrnaty(Gray TOP) Covid-19 Vaccine 03/02/2021  9:48 AM 0.3 mL 10/09/2020 Intramuscular   Manufacturer: [employer], [employer]   Lot: [id number]   NDC: [phone]

## 2021-03-09 ENCOUNTER — Encounter: Payer: Self-pay | Admitting: Family Medicine

## 2021-03-09 DIAGNOSIS — D563 Thalassemia minor: Secondary | ICD-10-CM | POA: Insufficient documentation

## 2021-03-13 LAB — THALASSEMIA AND HEMOGLOBINOPATHY COMPREHENSIVE EVALUATION
Ferritin: 86 ng/mL (ref 16–288)
Fetal Hemoglobin Testing: 0 % (ref <2.0)
HCT: 40.5 % (ref 35.0–45.0)
Hemoglobin A2 - HGBRFX: 4.5 % — ABNORMAL HIGH (ref 2.2–3.2)
Hemoglobin: 12.6 g/dL (ref 11.7–15.5)
Hgb A: 95.5 % — ABNORMAL LOW (ref >96.0)
MCH: 21 pg — ABNORMAL LOW (ref 27.0–33.0)
MCHC: 31.1 g/dL — ABNORMAL LOW (ref 32.0–36.0)
MCV: 67.4 fL — ABNORMAL LOW (ref 80.0–100.0)
RBC: 6.01 10*6/uL — ABNORMAL HIGH (ref 3.80–5.10)
RDW: 15.3 % — ABNORMAL HIGH (ref 11.0–15.0)

## 2021-03-13 LAB — BETA-GLOBIN COMPLETE REFLEX

## 2021-03-13 LAB — GENO PHENO REVIEW

## 2021-03-20 ENCOUNTER — Ambulatory Visit (INDEPENDENT_AMBULATORY_CARE_PROVIDER_SITE_OTHER): Payer: Medicare HMO | Admitting: Family Medicine

## 2021-03-20 ENCOUNTER — Encounter: Payer: Self-pay | Admitting: Family Medicine

## 2021-03-20 ENCOUNTER — Other Ambulatory Visit: Payer: Self-pay

## 2021-03-20 VITALS — BP 140/82 | HR 90 | Temp 98.6°F | Ht 62.0 in | Wt 165.8 lb

## 2021-03-20 DIAGNOSIS — I10 Essential (primary) hypertension: Secondary | ICD-10-CM

## 2021-03-20 DIAGNOSIS — D563 Thalassemia minor: Secondary | ICD-10-CM

## 2021-03-20 MED ORDER — AMLODIPINE BESYLATE 5 MG PO TABS
5.0000 mg | ORAL_TABLET | Freq: Every day | ORAL | 3 refills | Status: DC
Start: 2021-03-20 — End: 2021-09-18

## 2021-03-20 NOTE — Patient Instructions (Signed)
You are doing great!!!! Keep up the good work.  I sent in 90 day supply with refills of your blood pressure medication!   I have loved taking care of you! Blessings!!! Dr. Artis Flock

## 2021-03-20 NOTE — Progress Notes (Signed)
Patient: Sabrina [name] MRN: [id number] DOB: 28-Oct-1951 PCP: Orland Mustard, MD     Subjective:  Chief Complaint  Patient presents with  . Hypertension    HPI: The patient is a 70 y.o. female who presents today for HTN follow up. I started her on novasc 5mg  at her last visit and she is taking this daily. She has lost 2 pounds. She is walking 3 miles/day, doing strength and resistance training. She is also doing silver sneakers.   Review of Systems  Constitutional: Negative for chills, fatigue and fever.  HENT: Negative for dental problem, ear pain, hearing loss and trouble swallowing.   Eyes: Negative for visual disturbance.  Respiratory: Negative for cough, chest tightness and shortness of breath.   Cardiovascular: Negative for chest pain, palpitations and leg swelling.  Gastrointestinal: Negative for abdominal pain, blood in stool, diarrhea and nausea.  Endocrine: Negative for cold intolerance, polydipsia, polyphagia and polyuria.  Genitourinary: Negative for dysuria and hematuria.  Musculoskeletal: Negative for arthralgias.  Skin: Negative for rash.  Neurological: Negative for dizziness and headaches.  Psychiatric/Behavioral: Negative for dysphoric mood and sleep disturbance. The patient is not nervous/anxious.     Allergies Patient has No Known Allergies.  Past Medical History Patient  has no past medical history on file.  Surgical History Patient  has no past surgical history on file.  Family History Pateint's family history includes Hypertension in her mother.  Social History Patient  reports that she has quit smoking. She has never used smokeless tobacco. She reports previous alcohol use. She reports that she does not use drugs.    Objective: Vitals:   03/20/21 0822  BP: 140/82  Pulse: 90  Temp: 98.6 F (37 C)  TempSrc: Temporal  SpO2: 100%  Weight: 165 lb 12.8 oz (75.2 kg)  Height: 5\' 2"  (1.575 m)    Body mass index is 30.33 kg/m.  Physical  Exam Vitals reviewed.  Constitutional:      Appearance: Normal appearance. She is normal weight.  HENT:     Head: Normocephalic and atraumatic.  Cardiovascular:     Rate and Rhythm: Normal rate and regular rhythm.     Heart sounds: Murmur heard.    Pulmonary:     Effort: Pulmonary effort is normal.     Breath sounds: Normal breath sounds.  Abdominal:     General: Abdomen is flat. Bowel sounds are normal.     Palpations: Abdomen is soft.  Skin:    Capillary Refill: Capillary refill takes less than 2 seconds.  Neurological:     General: No focal deficit present.     Mental Status: She is alert and oriented to person, place, and time.  Psychiatric:        Mood and Affect: Mood normal.        Behavior: Behavior normal.        Assessment/plan: 1. Benign essential HTN Blood pressure is to goal. Continue current anti-hypertensive medication-novasc 5mg /day. Refills given and routine lab work will be done today. Recommended routine exercise and healthy diet including DASH diet and mediterranean diet. Encouraged weight loss. F/u in 6 months.    2. Beta thalassemia trait She has discussed with family (family planning for her kids/grandkids). Nothing else to do.    -also discuss colon cancer screening at f/u. Sent mychart message to see if I can refer for this today.    Return in about 6 months (around 09/20/2021) for routine htn f/u. toc with alyssa.   , MD  [facility]   03/20/2021

## 2021-04-29 ENCOUNTER — Ambulatory Visit: Payer: Medicare HMO

## 2021-07-28 ENCOUNTER — Other Ambulatory Visit (HOSPITAL_BASED_OUTPATIENT_CLINIC_OR_DEPARTMENT_OTHER): Payer: Self-pay

## 2021-07-28 MED ORDER — INFLUENZA VAC A&B SA ADJ QUAD 0.5 ML IM PRSY
PREFILLED_SYRINGE | INTRAMUSCULAR | 0 refills | Status: DC
  Filled 2021-07-28: qty 0.5, 1d supply, fill #0

## 2021-09-18 ENCOUNTER — Other Ambulatory Visit: Payer: Self-pay

## 2021-09-18 ENCOUNTER — Ambulatory Visit (INDEPENDENT_AMBULATORY_CARE_PROVIDER_SITE_OTHER): Payer: Medicare HMO | Admitting: Physician Assistant

## 2021-09-18 ENCOUNTER — Encounter: Payer: Medicare HMO | Admitting: Physician Assistant

## 2021-09-18 ENCOUNTER — Encounter: Payer: Self-pay | Admitting: Physician Assistant

## 2021-09-18 VITALS — BP 128/66 | HR 78 | Temp 98.2°F | Ht 62.0 in | Wt 166.4 lb

## 2021-09-18 DIAGNOSIS — I10 Essential (primary) hypertension: Secondary | ICD-10-CM

## 2021-09-18 DIAGNOSIS — M79642 Pain in left hand: Secondary | ICD-10-CM

## 2021-09-18 DIAGNOSIS — M79641 Pain in right hand: Secondary | ICD-10-CM

## 2021-09-18 MED ORDER — AMLODIPINE BESYLATE 5 MG PO TABS
5.0000 mg | ORAL_TABLET | Freq: Every day | ORAL | 3 refills | Status: DC
Start: 2021-09-18 — End: 2022-10-07

## 2021-09-18 MED ORDER — DICLOFENAC SODIUM 1 % EX GEL: 2.0000 g | Freq: Four times a day (QID) | CUTANEOUS | 3 refills | Status: AC

## 2021-09-18 NOTE — Patient Instructions (Addendum)
Talk to your pharmacy about pneumonia, tetanus, and shingles vaccines.   Keep up the good work with drinking water and exercising.  I am proud of you!  Call sooner if you have any concerns.  I will see you back in 6 months.

## 2021-09-18 NOTE — Progress Notes (Signed)
Subjective:    Patient ID: Sabrina [name], female    DOB: 05-26-1951, 70 y.o.   MRN: [id number]  Chief Complaint  Patient presents with   Establish Care    HPI 70 y.o. patient presents today for new patient establishment with me.  Patient was previously established with Dr. Artis Flock.  Current Care Team: None   Acute Concerns: Bilateral hand / joint pain, deformities in her knuckles forming. Seems to be worsening over several months. Used to work with her hands a lot. No tx's tried so far.    Chronic Concerns: See PMH listed below, as well as A/P for details on issues we specifically discussed during today's visit.      History reviewed. No pertinent past medical history.  History reviewed. No pertinent surgical history.  Family History  Problem Relation Age of Onset   Hypertension Mother     Social History   Tobacco Use   Smoking status: Former   Smokeless tobacco: Never  Substance Use Topics   Alcohol use: Not Currently   Drug use: Never     No Known Allergies  Review of Systems NEGATIVE UNLESS OTHERWISE INDICATED IN HPI      Objective:     BP 128/66   Pulse 78   Temp 98.2 F (36.8 C)   Ht 5\' 2"  (1.575 m)   Wt 166 lb 6.1 oz (75.5 kg)   SpO2 97%   BMI 30.43 kg/m   Wt Readings from Last 3 Encounters:  09/18/21 166 lb 6.1 oz (75.5 kg)  03/20/21 165 lb 12.8 oz (75.2 kg)  02/16/21 166 lb (75.3 kg)    BP Readings from Last 3 Encounters:  09/18/21 128/66  03/20/21 140/82  02/16/21 (!) 180/90     Physical Exam Vitals reviewed.  Constitutional:      Appearance: Normal appearance. She is normal weight.  HENT:     Head: Normocephalic and atraumatic.  Eyes:     Extraocular Movements: Extraocular movements intact.     Conjunctiva/sclera: Conjunctivae normal.     Pupils: Pupils are equal, round, and reactive to light.  Cardiovascular:     Rate and Rhythm: Normal rate and regular rhythm.     Heart sounds: Murmur heard.  Pulmonary:      Effort: Pulmonary effort is normal.     Breath sounds: Normal breath sounds.  Musculoskeletal:     Comments: Bouchard's nodes present bilateral hands. Swelling in 2nd and 3rd MCPs bilaterally.   Skin:    General: Skin is warm.     Capillary Refill: Capillary refill takes less than 2 seconds.  Neurological:     General: No focal deficit present.     Mental Status: She is alert and oriented to person, place, and time.  Psychiatric:        Mood and Affect: Mood normal.        Behavior: Behavior normal.       Assessment & Plan:   Problem List Items Addressed This Visit       Cardiovascular and Mediastinum   Benign essential HTN - Primary   Relevant Medications   amLODipine (NORVASC) 5 MG tablet   Other Visit Diagnoses     Bilateral hand pain            Meds ordered this encounter  Medications   amLODipine (NORVASC) 5 MG tablet    Sig: Take 1 tablet (5 mg total) by mouth daily.    Dispense:  90 tablet  Refill:  3   diclofenac Sodium (VOLTAREN) 1 % GEL    Sig: Apply 2 g topically 4 (four) times daily.    Dispense:  100 g    Refill:  3    1. Benign essential HTN Blood pressure is to goal.  She is doing well on amlodipine 5 mg and I sent a refill of this today.  DASH diet recommended. Enjoying [employer]. Drinking 3-4 16 oz bottles of water daily.  She will continue to monitor at home and call if any changes or concerns.  2. Bilateral hand pain Appears to be osteoarthritis bilateral hands.  Discussed several options with her including taking Tylenol arthritis.  She wants to start with diclofenac gel 3-4 times daily and she will let me know how she is doing with this.  Also consider local steroid injections if severely worsening pain.  Biofreeze and warm Epsom salt soaks may also be helpful.  She is also going to talk to her pharmacy about several vaccines.  She is going to get her COVID booster today.  I tried to talk to her again about colonoscopy and she  declines at this time.   This note was prepared with assistance of Conservation officer, historic buildings. Occasional wrong-word or sound-a-like substitutions may have occurred due to the inherent limitations of voice recognition software.  Time Spent: 32 minutes of total time was spent on the date of the encounter performing the following actions: chart review prior to seeing the patient, obtaining history, performing a medically necessary exam, counseling on the treatment plan, placing orders, and documenting in our EHR.    Joselinne Lawal M Jodie Cavey, PA-C

## 2021-10-06 ENCOUNTER — Other Ambulatory Visit (HOSPITAL_BASED_OUTPATIENT_CLINIC_OR_DEPARTMENT_OTHER): Payer: Self-pay

## 2021-10-06 ENCOUNTER — Ambulatory Visit: Payer: Medicare HMO | Attending: Internal Medicine

## 2021-10-06 DIAGNOSIS — Z23 Encounter for immunization: Secondary | ICD-10-CM

## 2021-10-06 MED ORDER — PFIZER COVID-19 VAC BIVALENT 30 MCG/0.3ML IM SUSP
INTRAMUSCULAR | 0 refills | Status: DC
Start: 2021-10-06 — End: 2023-10-27
  Filled 2021-10-06: qty 0.3, 1d supply, fill #0

## 2021-10-06 NOTE — Progress Notes (Signed)
   Covid-19 Vaccination Clinic  Name:  Sabrina [name]    MRN: [id number] DOB: 03/25/1951  10/06/2021  Ms. Baby was observed post Covid-19 immunization for 15 minutes without incident. She was provided with Vaccine Information Sheet and instruction to access the V-Safe system.   [name] was instructed to call 911 with any severe reactions post vaccine: Difficulty breathing  Swelling of face and throat  A fast heartbeat  A bad rash all over body  Dizziness and weakness   Immunizations Administered     Name Date Dose VIS Date Route   Pfizer Covid-19 Vaccine Bivalent Booster 10/06/2021 11:04 AM 0.3 mL 07/01/2021 Intramuscular   Manufacturer: [employer], [employer]   Lot: [id number]   NDC: [phone]

## 2021-10-12 ENCOUNTER — Telehealth: Payer: Self-pay | Admitting: Physician Assistant

## 2021-10-12 NOTE — Telephone Encounter (Signed)
Patient called back.  States she does not want to schedule AWV b/c [facility] did not advise her to have one at her last OV.   States she will call back to schedule if she changes her mind.

## 2021-10-12 NOTE — Telephone Encounter (Signed)
Copied from CRM [id number]. Topic: Medicare AWV >> Oct 12, 2021  1:13 PM Harris-Coley, Avon Gully wrote: Reason for CRM: Left message for patient to schedule Annual Wellness Visit.  Please schedule with Nurse Health Advisor Lanier Ensign, RN at [facility].  Please call [phone] ask for [facility]

## 2021-12-02 ENCOUNTER — Telehealth: Payer: Self-pay

## 2021-12-02 NOTE — Telephone Encounter (Signed)
Sabrina [name] Key: [id number] - Rx #: [id number] Need help? Call us at [phone]  Outcome The patient currently has access to the requested medication and a Prior Authorization is not needed for the patient/medication.

## 2021-12-17 ENCOUNTER — Telehealth: Payer: Self-pay | Admitting: Physician Assistant

## 2021-12-17 NOTE — Telephone Encounter (Signed)
Copied from [place] [phone]. Topic: Medicare AWV >> Dec 17, 2021 10:24 AM Harris-Coley, Hannah Beat wrote: Reason for CRM: Left message for patient to schedule Annual Wellness Visit.  Please schedule with Nurse Health Advisor Charlott Rakes, RN at [facility].  Please call [phone] ask for [facility]

## 2022-01-25 DIAGNOSIS — Z961 Presence of intraocular lens: Secondary | ICD-10-CM | POA: Diagnosis not present

## 2022-01-25 DIAGNOSIS — H3581 Retinal edema: Secondary | ICD-10-CM | POA: Diagnosis not present

## 2022-01-25 DIAGNOSIS — H524 Presbyopia: Secondary | ICD-10-CM | POA: Diagnosis not present

## 2022-01-25 DIAGNOSIS — H269 Unspecified cataract: Secondary | ICD-10-CM | POA: Diagnosis not present

## 2022-01-25 DIAGNOSIS — H5200 Hypermetropia, unspecified eye: Secondary | ICD-10-CM | POA: Diagnosis not present

## 2022-02-12 DIAGNOSIS — H25811 Combined forms of age-related cataract, right eye: Secondary | ICD-10-CM | POA: Diagnosis not present

## 2022-02-12 DIAGNOSIS — H25812 Combined forms of age-related cataract, left eye: Secondary | ICD-10-CM | POA: Diagnosis not present

## 2022-02-12 DIAGNOSIS — H35341 Macular cyst, hole, or pseudohole, right eye: Secondary | ICD-10-CM | POA: Diagnosis not present

## 2022-03-04 DIAGNOSIS — H25811 Combined forms of age-related cataract, right eye: Secondary | ICD-10-CM | POA: Diagnosis not present

## 2022-03-19 DIAGNOSIS — H25812 Combined forms of age-related cataract, left eye: Secondary | ICD-10-CM | POA: Diagnosis not present

## 2022-03-19 DIAGNOSIS — H35341 Macular cyst, hole, or pseudohole, right eye: Secondary | ICD-10-CM | POA: Diagnosis not present

## 2022-03-19 DIAGNOSIS — H35362 Drusen (degenerative) of macula, left eye: Secondary | ICD-10-CM | POA: Diagnosis not present

## 2022-04-27 DIAGNOSIS — H35341 Macular cyst, hole, or pseudohole, right eye: Secondary | ICD-10-CM | POA: Diagnosis not present

## 2022-04-27 DIAGNOSIS — Z01818 Encounter for other preprocedural examination: Secondary | ICD-10-CM | POA: Diagnosis not present

## 2022-05-11 DIAGNOSIS — H35341 Macular cyst, hole, or pseudohole, right eye: Secondary | ICD-10-CM | POA: Diagnosis not present

## 2022-06-14 IMAGING — DX DG CERVICAL SPINE COMPLETE 4+V
7 series · 7 of 7 positions shown · non-contrast
Comparison: None.

CLINICAL DATA: Neck pain, no known injury, initial encounter

EXAM:
CERVICAL SPINE - COMPLETE 4+ VIEW

[c-spine lat]
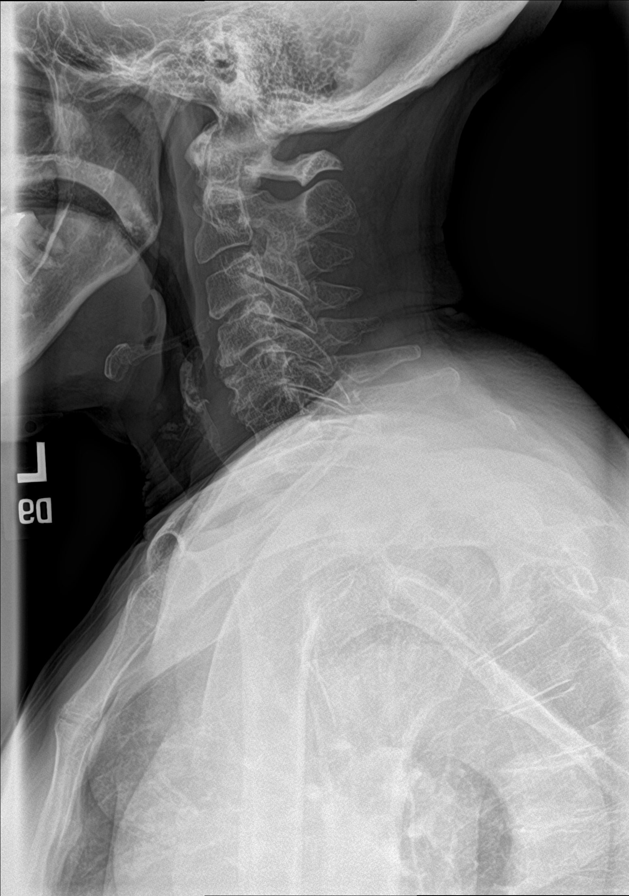

[c-spine obl (1 of 2)]
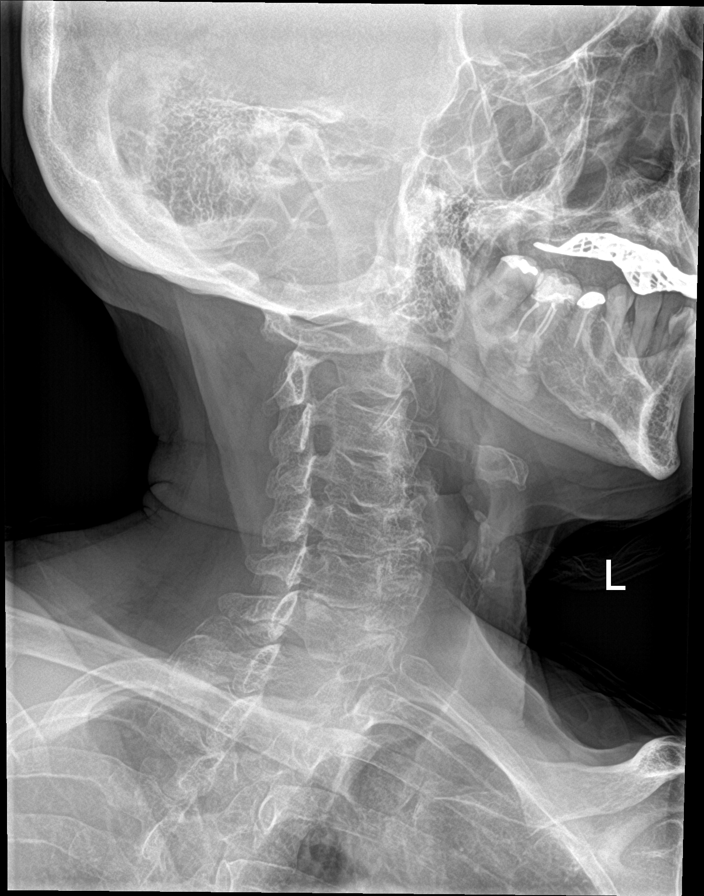

[c-spine obl (2 of 2)]
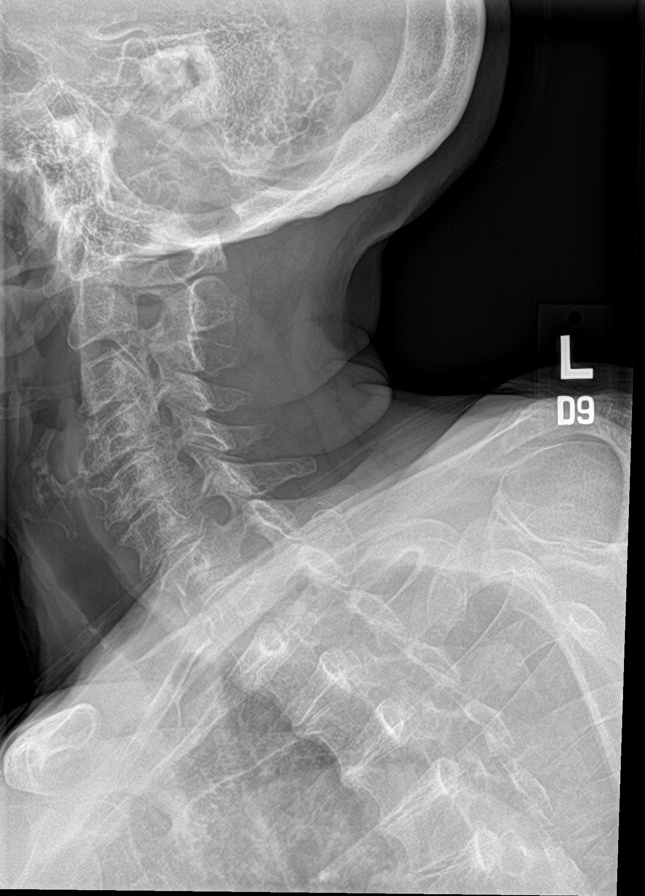

[c-spine ap]
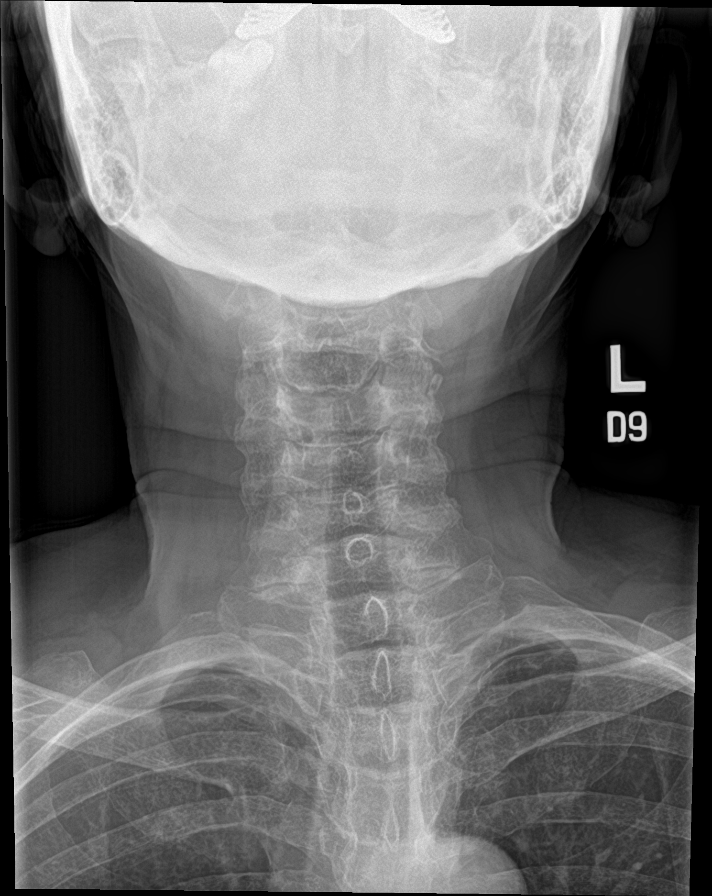

[c-spine open mouth (1 of 2)]
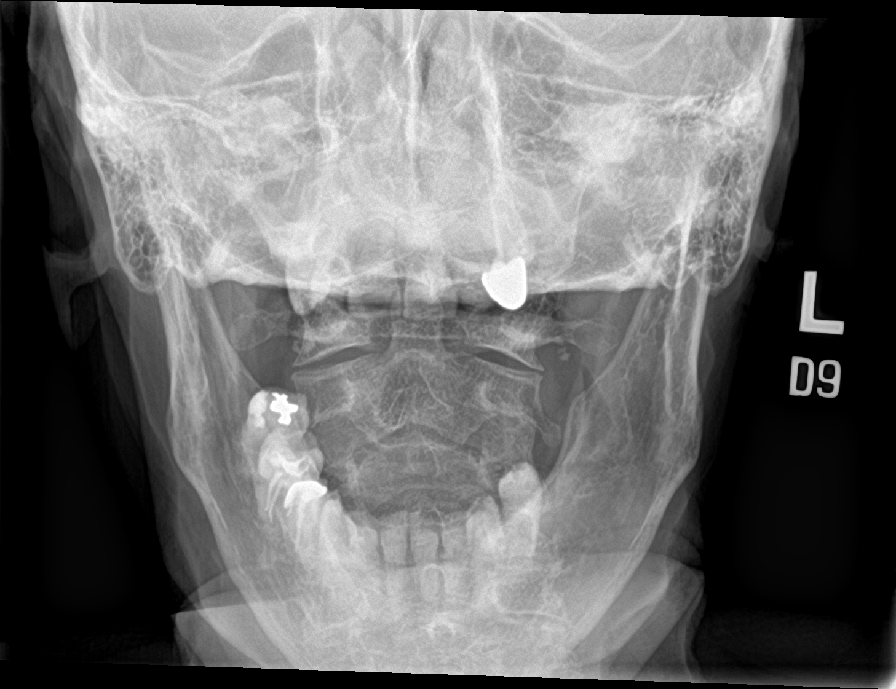

[c-spine swimmers trauma]
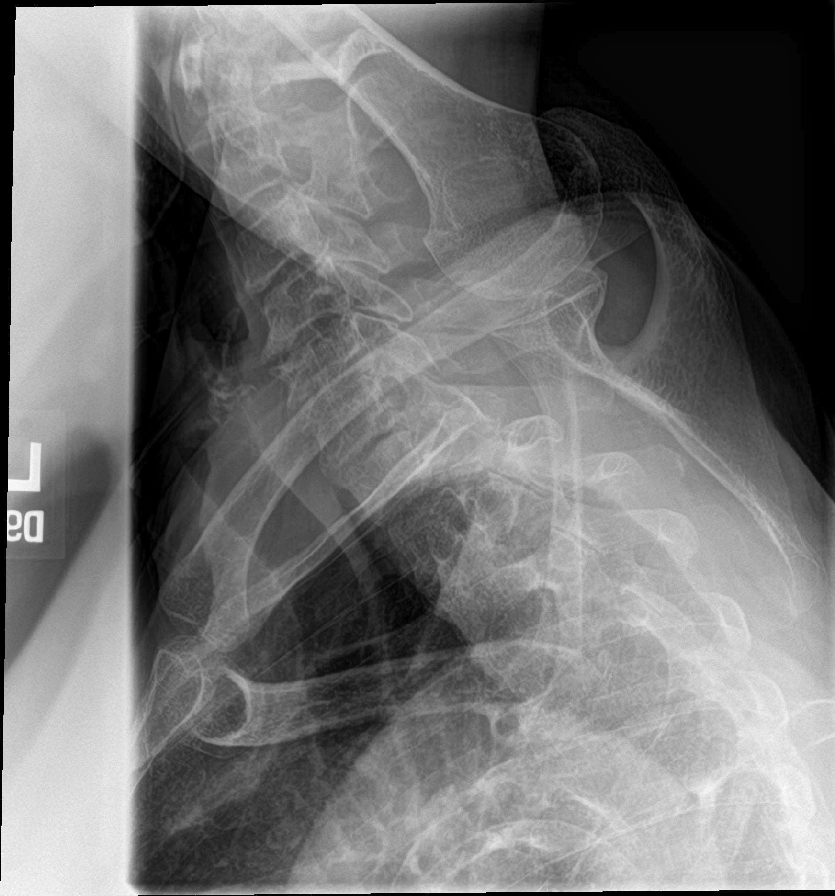

[c-spine open mouth (2 of 2)]
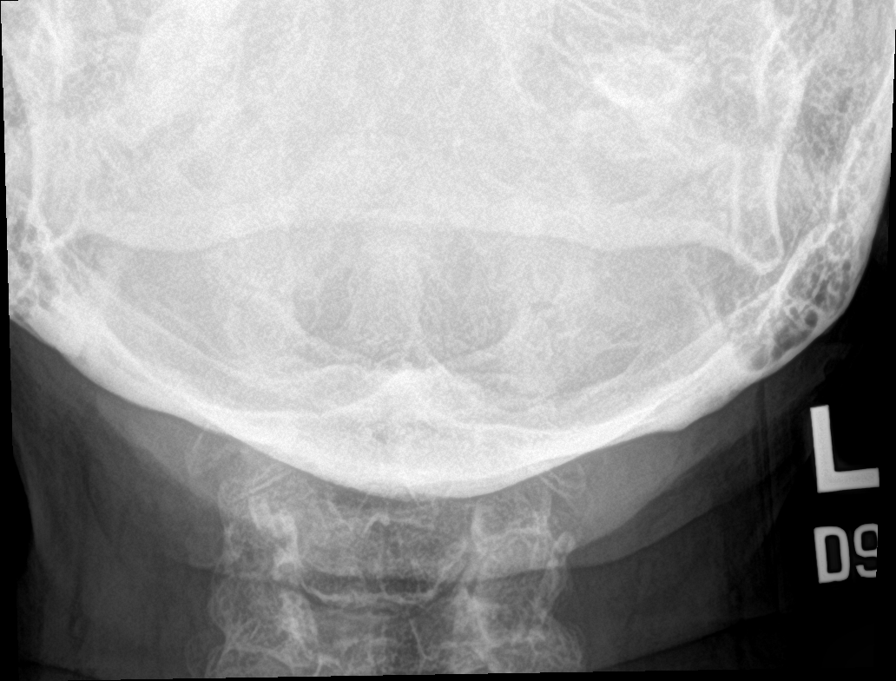

[7 of 7 positions shown; findings below may reference images not displayed]

FINDINGS: Seven cervical segments are well visualized. Vertebral body height
is well maintained. Disc space narrowing from C3-C7 is noted. Mild
osteophytic changes are seen. Facet hypertrophic changes are noted
as well. Mild neural foraminal narrowing is noted at C4-5 and C5-6
bilaterally. The odontoid is within normal limits. Prevertebral soft
tissues are within normal limits.
IMPRESSION: Multilevel degenerative change as described.

## 2022-06-30 DIAGNOSIS — H35341 Macular cyst, hole, or pseudohole, right eye: Secondary | ICD-10-CM | POA: Diagnosis not present

## 2022-07-20 ENCOUNTER — Other Ambulatory Visit (HOSPITAL_BASED_OUTPATIENT_CLINIC_OR_DEPARTMENT_OTHER): Payer: Self-pay

## 2022-07-20 MED ORDER — INFLUENZA VAC A&B SA ADJ QUAD 0.5 ML IM PRSY
PREFILLED_SYRINGE | INTRAMUSCULAR | 0 refills | Status: DC
  Filled 2022-07-20: qty 0.5, 1d supply, fill #0

## 2022-07-21 ENCOUNTER — Other Ambulatory Visit (HOSPITAL_BASED_OUTPATIENT_CLINIC_OR_DEPARTMENT_OTHER): Payer: Self-pay

## 2022-08-03 ENCOUNTER — Other Ambulatory Visit (HOSPITAL_BASED_OUTPATIENT_CLINIC_OR_DEPARTMENT_OTHER): Payer: Self-pay

## 2022-08-19 ENCOUNTER — Telehealth: Payer: Self-pay | Admitting: Physician Assistant

## 2022-08-19 NOTE — Telephone Encounter (Signed)
Copied from [place] [phone]. Topic: Medicare AWV >> Aug 19, 2022 11:11 AM Devoria Glassing wrote: Reason for CRM: Left message for patient to schedule Annual Wellness Visit.  Please schedule with Nurse Health Advisor Charlott Rakes, RN at [facility]. This appt can be telephone or office visit. Please call [phone] ask for [facility]

## 2022-08-27 DIAGNOSIS — H25812 Combined forms of age-related cataract, left eye: Secondary | ICD-10-CM | POA: Diagnosis not present

## 2022-08-27 DIAGNOSIS — H35341 Macular cyst, hole, or pseudohole, right eye: Secondary | ICD-10-CM | POA: Diagnosis not present

## 2022-09-13 DIAGNOSIS — H269 Unspecified cataract: Secondary | ICD-10-CM | POA: Diagnosis not present

## 2022-09-13 DIAGNOSIS — H5200 Hypermetropia, unspecified eye: Secondary | ICD-10-CM | POA: Diagnosis not present

## 2022-09-13 DIAGNOSIS — H524 Presbyopia: Secondary | ICD-10-CM | POA: Diagnosis not present

## 2022-09-13 DIAGNOSIS — H3581 Retinal edema: Secondary | ICD-10-CM | POA: Diagnosis not present

## 2022-09-13 DIAGNOSIS — Z961 Presence of intraocular lens: Secondary | ICD-10-CM | POA: Diagnosis not present

## 2022-09-18 DIAGNOSIS — Z0100 Encounter for examination of eyes and vision without abnormal findings: Secondary | ICD-10-CM | POA: Diagnosis not present

## 2022-09-28 ENCOUNTER — Other Ambulatory Visit (HOSPITAL_BASED_OUTPATIENT_CLINIC_OR_DEPARTMENT_OTHER): Payer: Self-pay

## 2022-10-07 ENCOUNTER — Other Ambulatory Visit: Payer: Self-pay | Admitting: Physician Assistant

## 2023-07-12 ENCOUNTER — Other Ambulatory Visit (HOSPITAL_BASED_OUTPATIENT_CLINIC_OR_DEPARTMENT_OTHER): Payer: Self-pay

## 2023-07-12 MED ORDER — COMIRNATY 30 MCG/0.3ML IM SUSY
0.3000 mL | PREFILLED_SYRINGE | Freq: Once | INTRAMUSCULAR | 0 refills | Status: AC
  Filled 2023-07-12: qty 0.3, 1d supply, fill #0

## 2023-07-26 ENCOUNTER — Other Ambulatory Visit (HOSPITAL_BASED_OUTPATIENT_CLINIC_OR_DEPARTMENT_OTHER): Payer: Self-pay

## 2023-07-26 MED ORDER — INFLUENZA VAC A&B SURF ANT ADJ 0.5 ML IM SUSY
0.5000 mL | PREFILLED_SYRINGE | Freq: Once | INTRAMUSCULAR | 0 refills | Status: AC
  Filled 2023-07-26: qty 0.5, 1d supply, fill #0

## 2023-09-02 ENCOUNTER — Telehealth: Payer: Self-pay

## 2023-09-02 NOTE — Telephone Encounter (Signed)
Called pt in regards to scheduling an appointment to close GAPS in health screenings etc. Patient asked if she could return my call a little later to discuss and possibly schedule.

## 2023-10-14 ENCOUNTER — Other Ambulatory Visit: Payer: Self-pay | Admitting: Physician Assistant

## 2023-10-27 ENCOUNTER — Ambulatory Visit (INDEPENDENT_AMBULATORY_CARE_PROVIDER_SITE_OTHER): Payer: Medicare HMO | Admitting: Physician Assistant

## 2023-10-27 ENCOUNTER — Encounter: Payer: Self-pay | Admitting: Physician Assistant

## 2023-10-27 VITALS — BP 162/75 | HR 88 | Temp 97.9°F | Ht 62.0 in | Wt 167.2 lb

## 2023-10-27 DIAGNOSIS — R051 Acute cough: Secondary | ICD-10-CM

## 2023-10-27 DIAGNOSIS — J0110 Acute frontal sinusitis, unspecified: Secondary | ICD-10-CM

## 2023-10-27 DIAGNOSIS — I10 Essential (primary) hypertension: Secondary | ICD-10-CM | POA: Diagnosis not present

## 2023-10-27 MED ORDER — AMLODIPINE BESYLATE 5 MG PO TABS: 5.0000 mg | ORAL_TABLET | Freq: Every day | ORAL | 0 refills | Status: DC

## 2023-10-27 MED ORDER — BENZONATATE 100 MG PO CAPS: 100.0000 mg | ORAL_CAPSULE | Freq: Three times a day (TID) | ORAL | 0 refills | Status: AC | PRN

## 2023-10-27 MED ORDER — AMOXICILLIN-POT CLAVULANATE 875-125 MG PO TABS: 1.0000 | ORAL_TABLET | Freq: Two times a day (BID) | ORAL | 0 refills | Status: DC

## 2023-10-27 NOTE — Patient Instructions (Signed)
Your blood pressure was elevated today - please try to monitor at home, keep stress levels down to the best of your ability, and continue on your amlodipine 5 mg daily. Keep well hydrated.  You also have a sinus infection. Please use nasal saline several times daily to irrigate sinuses. Humidifier in room at night. Take augmentin to help with bacterial infection.  Tessalon perles to help with cough.  Feel better! See you back soon.

## 2023-10-27 NOTE — Progress Notes (Signed)
Patient ID: Sabrina [name], female    DOB: [birth year], 72 y.o.   MRN: [id number]   Assessment & Plan:  Acute frontal sinusitis, recurrence not specified -     Amoxicillin-Pot Clavulanate; Take 1 tablet by mouth 2 (two) times daily.  Dispense: 20 tablet; Refill: 0  Acute cough  Benign essential HTN -     amLODIPine Besylate; Take 1 tablet (5 mg total) by mouth daily.  Dispense: 90 tablet; Refill: 0  Other orders -     Benzonatate; Take 1 capsule (100 mg total) by mouth 3 (three) times daily as needed for up to 20 days for cough.  Dispense: 30 capsule; Refill: 0    Assessment and Plan    Upper Respiratory Infection Persistent nasal congestion and cough with yellow sputum for approximately 2 weeks. No improvement with over-the-counter remedies. Likely bacterial sinusitis at this point. -Prescribe Augmentin. Tessalon perles. Nasal saline. Fluids.   Hypertension Elevated blood pressure at today's visit. Patient is currently on Amlodipine 5mg . -Continue Amlodipine 5mg  daily. Refilled today.  -Recheck blood pressure today per protocol. -Schedule follow-up visit in January with blood work.    Return in about 4 weeks (around 11/24/2023) for recheck/follow-up (fasting labs too), blood pressure check.    Subjective:    Chief Complaint  Patient presents with   Hypertension   Cough    Productive     Hypertension  Cough   Discussed the use of AI scribe software for clinical note transcription with the patient, who gave verbal consent to proceed.  History of Present Illness   The patient, with a history of hypertension and recent eye surgeries, presents with upper respiratory symptoms that started about a week before Christmas. She describes a cycle of symptoms that improve and then worsen again, including nasal congestion, postnasal drip, coughing up phlegm, and yellow nasal discharge. She also reports facial pain from blowing her nose. She has tried over-the-counter  cough medicine, fluids, and teas without lasting relief. She has been tested for COVID-19 and is up-to-date on her vaccinations. She also mentions a stressful personal situation with a missing goddaughter and a sister with dementia.       History reviewed. No pertinent past medical history.  History reviewed. No pertinent surgical history.  Family History  Problem Relation Age of Onset   Hypertension Mother     Social History   Tobacco Use   Smoking status: Former   Smokeless tobacco: Never  Substance Use Topics   Alcohol use: Not Currently   Drug use: Never     No Known Allergies  Review of Systems  Respiratory:  Positive for cough.    NEGATIVE UNLESS OTHERWISE INDICATED IN HPI      Objective:     BP (!) 162/75   Pulse 88   Temp 97.9 F (36.6 C) (Temporal)   Ht 5\' 2"  (1.575 m)   Wt 167 lb 3.2 oz (75.8 kg)   SpO2 98%   BMI 30.58 kg/m   Wt Readings from Last 3 Encounters:  10/27/23 167 lb 3.2 oz (75.8 kg)  09/18/21 166 lb 6.1 oz (75.5 kg)  03/20/21 165 lb 12.8 oz (75.2 kg)    BP Readings from Last 3 Encounters:  10/27/23 (!) 162/75  09/18/21 128/66  03/20/21 140/82     Physical Exam Vitals and nursing note reviewed.  Constitutional:      General: She is not in acute distress.    Appearance: Normal appearance. She is not ill-appearing.  HENT:     Head: Normocephalic.     Right Ear: Tympanic membrane, ear canal and external ear normal.     Left Ear: Tympanic membrane, ear canal and external ear normal.     Nose: Congestion present.     Right Turbinates: Enlarged.     Left Turbinates: Enlarged.     Right Sinus: Frontal sinus tenderness present.     Left Sinus: Frontal sinus tenderness present.     Mouth/Throat:     Mouth: Mucous membranes are moist.     Pharynx: No oropharyngeal exudate or posterior oropharyngeal erythema.  Eyes:     Extraocular Movements: Extraocular movements intact.     Conjunctiva/sclera: Conjunctivae normal.     Pupils:  Pupils are equal, round, and reactive to light.  Cardiovascular:     Rate and Rhythm: Normal rate and regular rhythm.     Pulses: Normal pulses.     Heart sounds: Normal heart sounds. No murmur heard. Pulmonary:     Effort: Pulmonary effort is normal. No respiratory distress.     Breath sounds: Normal breath sounds. No wheezing.  Musculoskeletal:     Cervical back: Normal range of motion.  Skin:    General: Skin is warm.  Neurological:     Mental Status: She is alert and oriented to person, place, and time.  Psychiatric:        Mood and Affect: Mood normal.        Behavior: Behavior normal.           Emara Lichter M Dalene Robards, PA-C

## 2024-01-22 ENCOUNTER — Other Ambulatory Visit: Payer: Self-pay | Admitting: Physician Assistant

## 2024-01-22 DIAGNOSIS — I10 Essential (primary) hypertension: Secondary | ICD-10-CM

## 2024-01-23 ENCOUNTER — Other Ambulatory Visit: Payer: Self-pay

## 2024-04-20 ENCOUNTER — Other Ambulatory Visit: Payer: Self-pay | Admitting: Physician Assistant

## 2024-04-20 DIAGNOSIS — I10 Essential (primary) hypertension: Secondary | ICD-10-CM

## 2024-06-01 HISTORY — PX: CATARACT EXTRACTION: SUR2

## 2024-06-20 ENCOUNTER — Ambulatory Visit (INDEPENDENT_AMBULATORY_CARE_PROVIDER_SITE_OTHER)

## 2024-06-20 VITALS — Ht 65.5 in | Wt 164.0 lb

## 2024-06-20 DIAGNOSIS — Z0000 Encounter for general adult medical examination without abnormal findings: Secondary | ICD-10-CM | POA: Diagnosis not present

## 2024-06-20 NOTE — Progress Notes (Signed)
 Subjective:   Sabrina [name] is a 73 y.o. who presents for a Medicare Wellness preventive visit.  As a reminder, Annual Wellness Visits don't include a physical exam, and some assessments may be limited, especially if this visit is performed virtually. We may recommend an in-person follow-up visit with your provider if needed.  Visit Complete: Virtual I connected with  Sabrina [name] on 06/20/24 by a audio enabled telemedicine application and verified that I am speaking with the correct person using two identifiers.  Patient Location: Home  Provider Location: Home Office  I discussed the limitations of evaluation and management by telemedicine. The patient expressed understanding and agreed to proceed.  Vital Signs: Because this visit was a virtual/telehealth visit, some criteria may be missing or patient reported. Any vitals not documented were not able to be obtained and vitals that have been documented are patient reported.  VideoDeclined- This patient declined Librarian, academic. Therefore the visit was completed with audio only.  Persons Participating in Visit: Patient.  AWV Questionnaire: Yes: Patient Medicare AWV questionnaire was completed by the patient on 06/19/24; I have confirmed that all information answered by patient is correct and no changes since this date.  Cardiac Risk Factors include: advanced age (>4men, >19 women);hypertension     Objective:    Today's Vitals   06/20/24 0846  Weight: 164 lb (74.4 kg)  Height: 5' 5.5 (1.664 m)   Body mass index is 26.88 kg/m.     06/20/2024    8:52 AM  Advanced Directives  Does Patient Have a Medical Advance Directive? Yes  Type of Estate agent of [place];Living will  Copy of Healthcare Power of Attorney in Chart? No - copy requested    Current Medications (verified) Outpatient Encounter Medications as of 06/20/2024  Medication Sig   amLODipine   (NORVASC ) 5 MG tablet TAKE 1 TABLET (5 MG TOTAL) BY MOUTH DAILY.   diclofenac  Sodium (VOLTAREN ) 1 % GEL Apply 2 g topically 4 (four) times daily.   Multiple Vitamins-Minerals (WOMENS MULTIVITAMIN) TABS Take by mouth.   No facility-administered encounter medications on file as of 06/20/2024.    Allergies (verified) Patient has no known allergies.   History: History reviewed. No pertinent past medical history. History reviewed. No pertinent surgical history. Family History  Problem Relation Age of Onset   Hypertension Mother    Social History   Socioeconomic History   Marital status: Married    Spouse name: Not on file   Number of children: Not on file   Years of education: Not on file   Highest education level: Associate degree: occupational, Scientist, product/process development, or vocational program  Occupational History   Not on file  Tobacco Use   Smoking status: Former   Smokeless tobacco: Never  Substance and Sexual Activity   Alcohol use: Not Currently   Drug use: Never   Sexual activity: Not on file  Other Topics Concern   Not on file  Social History Narrative   Not on file   Social Drivers of Health   Financial Resource Strain: Low Risk  (06/19/2024)   Overall Financial Resource Strain (CARDIA)    Difficulty of Paying Living Expenses: Not very hard  Food Insecurity: No Food Insecurity (06/19/2024)   Hunger Vital Sign    Worried About Running Out of Food in the Last Year: Never true    Ran Out of Food in the Last Year: Never true  Transportation Needs: No Transportation Needs (06/19/2024)   PRAPARE -  Administrator, Civil Service (Medical): No    Lack of Transportation (Non-Medical): No  Physical Activity: Insufficiently Active (06/19/2024)   Exercise Vital Sign    Days of Exercise per Week: 2 days    Minutes of Exercise per Session: 30 min  Stress: No Stress Concern Present (06/19/2024)   [employer] of Occupational Health - Occupational Stress Questionnaire     Feeling of Stress: Only a little  Social Connections: Moderately Integrated (06/19/2024)   Social Connection and Isolation Panel    Frequency of Communication with Friends and Family: Three times a week    Frequency of Social Gatherings with Friends and Family: Three times a week    Attends Religious Services: More than 4 times per year    Active Member of Clubs or Organizations: Yes    Attends Banker Meetings: More than 4 times per year    Marital Status: Widowed    Tobacco Counseling Counseling given: Not Answered    Clinical Intake:  Pre-visit preparation completed: Yes  Pain : No/denies pain     BMI - recorded: 26.88 Nutritional Status: BMI 25 -29 Overweight Nutritional Risks: None Diabetes: No  Lab Results  Component Value Date   HGBA1C 5.6 01/16/2021     How often do you need to have someone help you when you read instructions, pamphlets, or other written materials from your doctor or pharmacy?: 1 - Never  Interpreter Needed?: No  Information entered by :: Ellouise Haws, LPN   Activities of Daily Living     06/19/2024    9:30 AM  In your present state of health, do you have any difficulty performing the following activities:  Hearing? 0  Vision? 0  Difficulty concentrating or making decisions? 0  Walking or climbing stairs? 0  Dressing or bathing? 0  Doing errands, shopping? 0  Preparing Food and eating ? N  Using the Toilet? N  In the past six months, have you accidently leaked urine? N  Do you have problems with loss of bowel control? N  Managing your Medications? N  Managing your Finances? N  Housekeeping or managing your Housekeeping? N    Patient Care Team: Allwardt, Alyssa M, PA-C as PCP - General (Physician Assistant)  I have updated your Care Teams any recent Medical Services you may have received from other providers in the past year.     Assessment:   This is a routine wellness examination for  Sabrina [name].  Hearing/Vision screen Hearing Screening - Comments:: Pt denies any hearing  Vision Screening - Comments:: Wears rx glasses - up to date with routine eye exams with [name]    Goals Addressed             This Visit's Progress    Patient Stated       Lose weight        Depression Screen     06/20/2024    8:50 AM 10/27/2023    8:33 AM 01/16/2021   11:44 AM  PHQ 2/9 Scores  PHQ - 2 Score 0 0 0  PHQ- 9 Score   1    Fall Risk     06/19/2024    9:30 AM 10/27/2023    8:33 AM 02/16/2021   10:19 AM 01/16/2021    9:57 AM  Fall Risk   Falls in the past year? 0 0 0 0  Number falls in past yr: 0 0    Injury with Fall? 0 0  Risk for fall due to : No Fall Risks No Fall Risks  No Fall Risks  Follow up Falls prevention discussed       MEDICARE RISK AT HOME:  Medicare Risk at Home Any stairs in or around the home?: (Patient-Rptd) Yes If so, are there any without handrails?: (Patient-Rptd) No Home free of loose throw rugs in walkways, pet beds, electrical cords, etc?: (Patient-Rptd) Yes Adequate lighting in your home to reduce risk of falls?: (Patient-Rptd) Yes Life alert?: (Patient-Rptd) No Use of a cane, walker or w/c?: (Patient-Rptd) No Grab bars in the bathroom?: (Patient-Rptd) No Shower chair or bench in shower?: (Patient-Rptd) Yes Elevated toilet seat or a handicapped toilet?: (Patient-Rptd) No  TIMED UP AND GO:  Was the test performed?  No  Cognitive Function: 6CIT completed        06/20/2024    8:53 AM  6CIT Screen  What Year? 0 points  What month? 0 points  What time? 0 points  Count back from 20 0 points  Months in reverse 0 points  Repeat phrase 0 points  Total Score 0 points    Immunizations Immunization History  Administered Date(s) Administered   Fluad  Quad(high Dose 65+) 07/23/2022   Fluad  Trivalent(High Dose 65+) 07/26/2023   Influenza, High Dose Seasonal PF 07/22/2021   PFIZER Comirnaty (Gray Top)Covid-19 Tri-Sucrose  Vaccine 03/02/2021   PFIZER(Purple Top)SARS-COV-2 Vaccination 12/02/2019, 12/23/2019, 09/08/2020   Pfizer Covid-19 Vaccine Bivalent Booster 72yrs & up 10/06/2021   Pfizer(Comirnaty )Fall Seasonal Vaccine 12 years and older 07/12/2023    Screening Tests Health Maintenance  Topic Date Due   DTaP/Tdap/Td (1 - Tdap) Never done   Pneumococcal Vaccine: 50+ Years (1 of 1 - PCV) Never done   Zoster Vaccines- Shingrix (1 of 2) Never done   COVID-19 Vaccine (7 - 2024-25 season) 01/09/2024   INFLUENZA VACCINE  06/01/2024   DEXA SCAN  10/26/2024 (Originally 01/26/2014)   Colonoscopy  10/26/2024 (Originally 01/26/1994)   Medicare Annual Wellness (AWV)  06/20/2025   Hepatitis C Screening  Completed   HPV VACCINES  Aged Out   Meningococcal B Vaccine  Aged Out    Health Maintenance  Health Maintenance Due  Topic Date Due   DTaP/Tdap/Td (1 - Tdap) Never done   Pneumococcal Vaccine: 50+ Years (1 of 1 - PCV) Never done   Zoster Vaccines- Shingrix (1 of 2) Never done   COVID-19 Vaccine (7 - 2024-25 season) 01/09/2024   INFLUENZA VACCINE  06/01/2024   Health Maintenance Items Addressed: See Nurse Notes at the end of this note  Additional Screening:  Vision Screening: Recommended annual ophthalmology exams for early detection of glaucoma and other disorders of the eye. Would you like a referral to an eye doctor? No    Dental Screening: Recommended annual dental exams for proper oral hygiene  Community Resource Referral / Chronic Care Management: CRR required this visit?  No   CCM required this visit?  No   Plan:    I have personally reviewed and noted the following in the patient's chart:   Medical and social history Use of alcohol, tobacco or illicit drugs  Current medications and supplements including opioid prescriptions. Patient is not currently taking opioid prescriptions. Functional ability and status Nutritional status Physical activity Advanced directives List of other  physicians Hospitalizations, surgeries, and ER visits in previous 12 months Vitals Screenings to include cognitive, depression, and falls Referrals and appointments  In addition, I have reviewed and discussed with patient certain preventive protocols, quality metrics, and best  practice recommendations. A written personalized care plan for preventive services as well as general preventive health recommendations were provided to patient.   Ellouise VEAR Haws, LPN   1/79/7974   After Visit Summary: (Declined) Due to this being a telephonic visit, with patients personalized plan was offered to patient but patient Declined AVS at this time   Notes: PCP Follow Up Recommendations: Pt is asking if she can start omega 3 fish oil daily? Pt made aware of immunizations due

## 2024-06-20 NOTE — Patient Instructions (Signed)
 Sabrina [name] , Thank you for taking time out of your busy schedule to complete your Annual Wellness Visit with me. I enjoyed our conversation and look forward to speaking with you again next year. I, as well as your care team,  appreciate your ongoing commitment to your health goals. Please review the following plan we discussed and let me know if I can assist you in the future. Your Game plan/ To Do List    Referrals: If you haven't heard from the office you've been referred to, please reach out to them at the phone provided.   Follow up Visits: We will see or speak with you next year for your Next Medicare AWV with our clinical staff Have you seen your provider in the last 6 months (3 months if uncontrolled diabetes)? No  Clinician Recommendations:  Aim for 30 minutes of exercise or brisk walking, 6-8 glasses of water, and 5 servings of fruits and vegetables each day.        This is a list of the screenings recommended for you:  Health Maintenance  Topic Date Due   Medicare Annual Wellness Visit  Never done   DTaP/Tdap/Td vaccine (1 - Tdap) Never done   Pneumococcal Vaccine for age over 39 (1 of 1 - PCV) Never done   Zoster (Shingles) Vaccine (1 of 2) Never done   COVID-19 Vaccine (7 - 2024-25 season) 01/09/2024   Flu Shot  06/01/2024   DEXA scan (bone density measurement)  10/26/2024*   Colon Cancer Screening  10/26/2024*   Hepatitis C Screening  Completed   HPV Vaccine  Aged Out   Meningitis B Vaccine  Aged Out  *Topic was postponed. The date shown is not the original due date.    Advanced directives: (Copy Requested) Please bring a copy of your health care power of attorney and living will to the office to be added to your chart at your convenience. You can mail to [facility] [address] [place], [place] [address] or email to [email or web address] Advance Care Planning is important because it:  [x]  Makes sure you receive the medical care that is  consistent with your values, goals, and preferences  [x]  It provides guidance to your family and loved ones and reduces their decisional burden about whether or not they are making the right decisions based on your wishes.  Follow the link provided in your after visit summary or read over the paperwork we have mailed to you to help you started getting your Advance Directives in place. If you need assistance in completing these, please reach out to us  so that we can help you!  See attachments for Preventive Care and Fall Prevention Tips.

## 2024-07-10 DIAGNOSIS — H25812 Combined forms of age-related cataract, left eye: Secondary | ICD-10-CM | POA: Diagnosis not present

## 2024-07-10 DIAGNOSIS — H35341 Macular cyst, hole, or pseudohole, right eye: Secondary | ICD-10-CM | POA: Diagnosis not present

## 2024-07-10 DIAGNOSIS — H524 Presbyopia: Secondary | ICD-10-CM | POA: Diagnosis not present

## 2024-07-17 ENCOUNTER — Other Ambulatory Visit (HOSPITAL_BASED_OUTPATIENT_CLINIC_OR_DEPARTMENT_OTHER): Payer: Self-pay

## 2024-07-17 MED ORDER — FLUZONE HIGH-DOSE 0.5 ML IM SUSY
0.5000 mL | PREFILLED_SYRINGE | Freq: Once | INTRAMUSCULAR | 0 refills | Status: AC
  Filled 2024-07-17: qty 0.5, 1d supply, fill #0

## 2024-07-19 DIAGNOSIS — H25812 Combined forms of age-related cataract, left eye: Secondary | ICD-10-CM | POA: Diagnosis not present

## 2024-07-31 ENCOUNTER — Other Ambulatory Visit (HOSPITAL_BASED_OUTPATIENT_CLINIC_OR_DEPARTMENT_OTHER): Payer: Self-pay

## 2024-07-31 MED ORDER — COMIRNATY 30 MCG/0.3ML IM SUSY
0.3000 mL | PREFILLED_SYRINGE | Freq: Once | INTRAMUSCULAR | 0 refills | Status: AC
  Filled 2024-07-31: qty 0.3, 1d supply, fill #0

## 2024-08-08 ENCOUNTER — Other Ambulatory Visit: Payer: Self-pay | Admitting: Family

## 2024-08-08 DIAGNOSIS — I10 Essential (primary) hypertension: Secondary | ICD-10-CM

## 2024-08-31 ENCOUNTER — Other Ambulatory Visit: Payer: Self-pay | Admitting: Physician Assistant

## 2024-08-31 DIAGNOSIS — I10 Essential (primary) hypertension: Secondary | ICD-10-CM

## 2024-09-05 ENCOUNTER — Ambulatory Visit: Admitting: Physician Assistant

## 2024-09-05 ENCOUNTER — Encounter: Payer: Self-pay | Admitting: Physician Assistant

## 2024-09-05 VITALS — BP 164/88 | HR 77 | Temp 97.8°F | Ht 65.5 in | Wt 165.8 lb

## 2024-09-05 DIAGNOSIS — Z13220 Encounter for screening for lipoid disorders: Secondary | ICD-10-CM

## 2024-09-05 DIAGNOSIS — M25541 Pain in joints of right hand: Secondary | ICD-10-CM | POA: Diagnosis not present

## 2024-09-05 DIAGNOSIS — F5104 Psychophysiologic insomnia: Secondary | ICD-10-CM

## 2024-09-05 DIAGNOSIS — Z131 Encounter for screening for diabetes mellitus: Secondary | ICD-10-CM | POA: Diagnosis not present

## 2024-09-05 DIAGNOSIS — M25542 Pain in joints of left hand: Secondary | ICD-10-CM

## 2024-09-05 DIAGNOSIS — I10 Essential (primary) hypertension: Secondary | ICD-10-CM | POA: Diagnosis not present

## 2024-09-05 DIAGNOSIS — F419 Anxiety disorder, unspecified: Secondary | ICD-10-CM | POA: Diagnosis not present

## 2024-09-05 LAB — COMPREHENSIVE METABOLIC PANEL WITH GFR
ALT: 12 U/L (ref 0–35)
AST: 20 U/L (ref 0–37)
Albumin: 4.6 g/dL (ref 3.5–5.2)
Alkaline Phosphatase: 75 U/L (ref 39–117)
BUN: 13 mg/dL (ref 6–23)
CO2: 32 meq/L (ref 19–32)
Calcium: 10.1 mg/dL (ref 8.4–10.5)
Chloride: 101 meq/L (ref 96–112)
Creatinine, Ser: 0.78 mg/dL (ref 0.40–1.20)
GFR: 74.2 mL/min (ref >60.00)
Glucose, Bld: 94 mg/dL (ref 70–99)
Potassium: 4.9 meq/L (ref 3.5–5.1)
Sodium: 141 meq/L (ref 135–145)
Total Bilirubin: 0.6 mg/dL (ref 0.2–1.2)
Total Protein: 7.7 g/dL (ref 6.0–8.3)

## 2024-09-05 LAB — HEMOGLOBIN A1C: Hgb A1c MFr Bld: 5.7 % (ref 4.6–6.5)

## 2024-09-05 LAB — LIPID PANEL
Cholesterol: 256 mg/dL — ABNORMAL HIGH (ref 0–200)
HDL: 92.3 mg/dL (ref >39.00)
LDL Cholesterol: 148 mg/dL — ABNORMAL HIGH (ref 0–99)
NonHDL: 163.39
Total CHOL/HDL Ratio: 3
Triglycerides: 79 mg/dL (ref 0.0–149.0)
VLDL: 15.8 mg/dL (ref 0.0–40.0)

## 2024-09-05 NOTE — Progress Notes (Signed)
 Patient ID: Sabrina [name], female    DOB: 17-Mar-1951, 73 y.o.   MRN: [id number]   Assessment & Plan:  Benign essential HTN -     Comprehensive metabolic panel with GFR -     Lipid panel -     Hemoglobin A1c  Arthralgia of hands, bilateral  Chronic insomnia  Anxiety  Screening for cholesterol level -     Lipid panel  Screening for diabetes mellitus -     Comprehensive metabolic panel with GFR -     Hemoglobin A1c      Assessment and Plan Assessment & Plan Essential hypertension Blood pressure is elevated in office at 148/88 mmHg, likely due to anxiety and situational factors. Home readings are approximately 130/70 mmHg, indicating good control outside of clinical settings. Family history of hypertension. Current medication is amlodipine  5 mg daily. - Continue amlodipine  5 mg daily  Right hand joint pain likely due to osteoarthritis Chronic right hand joint pain, likely due to osteoarthritis, exacerbated by previous occupational activities. Pain is managed with Voltaren  gel and compresses. Discussed potential for steroid injections if pain becomes unmanageable. - Continue Voltaren  gel application 2-3 times daily - Will consider steroid injections if pain becomes unmanageable  Insomnia and anxiety Chronic insomnia and anxiety, exacerbated by recent life stressors including family responsibilities and bereavement. Prefers non-pharmacological interventions. Discussed melatonin as a natural supplement to aid sleep, especially given age-related changes in melatonin production. - Start melatonin 5-10 mg over-the-counter, taken a couple of hours before bed  General Health Maintenance Discussed the importance of regular exercise and maintaining a healthy lifestyle. No recent blood work to assess kidney and liver function or lipid levels. - Ordered CMP, lipid panel, and A1c to assess overall health status - Encouraged regular exercise and healthy lifestyle  choices      Return in about 4 weeks (around 10/03/2024) for blood pressure check (and to go over labs).    Subjective:    Chief Complaint  Patient presents with   Follow-up    Follow-up on blood pressure. Wants to talk about getting off the meds due to healthy living and eating.    HPI Discussed the use of AI scribe software for clinical note transcription with the patient, who gave verbal consent to proceed.  History of Present Illness Sabrina [name] is a 73 year old female with hypertension who presents for a follow-up on her blood pressure.  She has a history of hypertension and is currently taking amlodipine  5 mg daily. Her blood pressure at home is typically around 130/70 mmHg, but she reports that it tends to rise during medical visits due to anxiety.  She experiences difficulty with sleep, characterized by waking up in the middle of the night and having trouble returning to sleep. She attributes these issues to anxiety and the need for companionship since her husband's passing. She has not used any sleep medications and prefers to avoid them.  She has a history of a macular hole following cataract surgery a year or two ago, which required her to maintain a head-down position for a week. Her vision has declined, affecting her ability to engage in activities she enjoys.  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 She uses Voltaren  gel and a compress for relief, which she finds helpful. Her mother had rheumatoid arthritis.  She is concerned about her weight, currently at 165 pounds, having  gained 20 pounds since moving. She previously weighed 145 pounds and associates her weight gain with her blood pressure issues. She is interested in losing weight and considering dietary changes.  No issues with urination or bowel movements. She has been drinking more water recently. No  bleeding, constipation, or diarrhea.     History reviewed. No pertinent past medical history.  Past Surgical History:  Procedure Laterality Date   CATARACT EXTRACTION Right 06/2024    Family History  Problem Relation Age of Onset   Hypertension Mother    Dementia Brother     Social History   Tobacco Use   Smoking status: Former   Smokeless tobacco: Never  Substance Use Topics   Alcohol use: Not Currently   Drug use: Never     No Known Allergies  Review of Systems NEGATIVE UNLESS OTHERWISE INDICATED IN HPI      Objective:     BP (!) 164/88   Pulse 77   Temp 97.8 F (36.6 C) (Temporal)   Ht 5' 5.5 (1.664 m)   Wt 165 lb 12.8 oz (75.2 kg)   SpO2 96%   BMI 27.17 kg/m   Wt Readings from Last 3 Encounters:  09/05/24 165 lb 12.8 oz (75.2 kg)  06/20/24 164 lb (74.4 kg)  10/27/23 167 lb 3.2 oz (75.8 kg)    BP Readings from Last 3 Encounters:  09/05/24 (!) 164/88  10/27/23 (!) 162/75  09/18/21 128/66     Physical Exam Vitals and nursing note reviewed.  Constitutional:      General: She is not in acute distress.    Appearance: Normal appearance. She is not ill-appearing.  HENT:     Head: Normocephalic.     Right Ear: External ear normal.     Left Ear: External ear normal.  Eyes:     Extraocular Movements: Extraocular movements intact.     Conjunctiva/sclera: Conjunctivae normal.     Pupils: Pupils are equal, round, and reactive to light.  Cardiovascular:     Rate and Rhythm: Normal rate and regular rhythm.     Pulses: Normal pulses.     Heart sounds: Murmur heard.  Pulmonary:     Effort: Pulmonary effort is normal. No respiratory distress.     Breath sounds: Normal breath sounds. No wheezing.  Musculoskeletal:        General: Deformity (bilateral hands arthritis changes) present.     Cervical back: Normal range of motion.  Skin:    General: Skin is warm.  Neurological:     Mental Status: She is alert and oriented to person, place, and time.   Psychiatric:        Mood and Affect: Mood normal.        Behavior: Behavior normal.             Mychele Seyller M Eldrige Pitkin, PA-C

## 2024-09-05 NOTE — Patient Instructions (Addendum)
 Track BP at home, bring log back with you  Labs today  Try Melatonin 5-10 mg OTC a few hours before bedtime  Try Voltaren  gel two to three times daily on hands, epsom salt soaks; let me know if needing to see sports med

## 2024-09-06 ENCOUNTER — Encounter: Payer: Self-pay | Admitting: Physician Assistant

## 2024-09-07 ENCOUNTER — Ambulatory Visit: Payer: Self-pay | Admitting: Physician Assistant

## 2024-09-07 ENCOUNTER — Other Ambulatory Visit: Payer: Self-pay

## 2024-09-07 DIAGNOSIS — I10 Essential (primary) hypertension: Secondary | ICD-10-CM

## 2024-09-07 MED ORDER — AMLODIPINE BESYLATE 5 MG PO TABS: 5.0000 mg | ORAL_TABLET | Freq: Every day | ORAL | 2 refills | Status: AC

## 2024-09-18 ENCOUNTER — Ambulatory Visit: Payer: Self-pay

## 2024-09-18 NOTE — Telephone Encounter (Signed)
 FYI Only or Action Required?: Action required by provider: antibiotic request.  Patient was last seen in primary care on 09/05/2024 by Allwardt, Alyssa M, PA-C.  Called Nurse Triage reporting Cough.  Symptoms began several days ago.  Interventions attempted: OTC medications: Mucinex and Rest, hydration, or home remedies.  Symptoms are: gradually worsening.  Triage Disposition: Home Care  Patient/caregiver understands and will follow disposition?: No, wishes to speak with PCP    Copied from CRM #[id number]. Topic: Clinical - Red Word Triage >> Sep 18, 2024  8:51 AM Antwanette L wrote: Red Word that prompted transfer to Nurse Triage: The patient is calling because, although she feels well overall, she is experiencing head drainage and is coughing up yellow phlegm. She reports no fever. Reason for Disposition  Cough with cold symptoms (e.g., runny nose, postnasal drip, throat clearing)  Answer Assessment - Initial Assessment Questions Additional info: Patient requesting antibiotics to be sent to [facility] for suspected sinus infection, offered acute office visit but she declines and states that she has sinus infections each year around this time and pcp sends in script for antibiotic. Requesting antibiotic to pharmacy. Please follow up with patient, she prefers a phone call vs MyChart.    1. ONSET: When did the cough begin?      Sunday 2. SEVERITY: How bad is the cough today?      mild 3. SPUTUM: Describe the color of your sputum (e.g., none, dry cough; clear, white, yellow, green)     yellow 4. HEMOPTYSIS: Are you coughing up any blood? If Yes, ask: How much? (e.g., flecks, streaks, tablespoons, etc.)     denies 5. DIFFICULTY BREATHING: Are you having difficulty breathing? If Yes, ask: How bad is it? (e.g., mild, moderate, severe)      denies 6. FEVER: Do you have a fever? If Yes, ask: What is your temperature, how was it measured, and when did it start?     denies 7.  CARDIAC HISTORY: Do you have any history of heart disease? (e.g., heart attack, congestive heart failure)       8. LUNG HISTORY: Do you have any history of lung disease?  (e.g., pulmonary embolus, asthma, emphysema)      9. PE RISK FACTORS: Do you have a history of blood clots? (or: recent major surgery, recent prolonged travel, bedridden)      10 . OTHER SYMPTOMS: Do you have any other symptoms? (e.g., runny nose, wheezing, chest pain)       Horse voice, chest congestion, sinus pressure behind eyes  Protocols used: Cough - Acute Productive-A-AH

## 2024-09-18 NOTE — Telephone Encounter (Signed)
 Please see triage note, pt refused acute visit and requesting antibiotic. Ok to advise pt needs appt for antibiotic

## 2024-09-18 NOTE — Telephone Encounter (Signed)
 Noted

## 2024-09-18 NOTE — Telephone Encounter (Signed)
 Called pt to advise needing an acute OV to be examined. Pt very upset stating she wasn't offered an appt this morning with triage nurse. At first patient wasn't wanting to make an appointment then decided to schedule visit. Offered pt sooner appt with other providers in office and pt declined visit.

## 2024-09-21 ENCOUNTER — Other Ambulatory Visit (HOSPITAL_BASED_OUTPATIENT_CLINIC_OR_DEPARTMENT_OTHER): Payer: Self-pay

## 2024-09-21 ENCOUNTER — Ambulatory Visit (INDEPENDENT_AMBULATORY_CARE_PROVIDER_SITE_OTHER): Admitting: Physician Assistant

## 2024-09-21 ENCOUNTER — Encounter: Payer: Self-pay | Admitting: Physician Assistant

## 2024-09-21 VITALS — BP 136/72 | HR 75 | Temp 97.2°F | Ht 65.0 in | Wt 166.0 lb

## 2024-09-21 DIAGNOSIS — J069 Acute upper respiratory infection, unspecified: Secondary | ICD-10-CM | POA: Diagnosis not present

## 2024-09-21 DIAGNOSIS — I10 Essential (primary) hypertension: Secondary | ICD-10-CM

## 2024-09-21 DIAGNOSIS — E7800 Pure hypercholesterolemia, unspecified: Secondary | ICD-10-CM | POA: Diagnosis not present

## 2024-09-21 NOTE — Progress Notes (Signed)
 Patient ID: Sabrina [name], female    DOB: Dec 31, 1950, 73 y.o.   MRN: [id number]   Assessment & Plan:  Acute URI  Benign essential HTN  Elevated cholesterol      Assessment and Plan Assessment & Plan Acute viral upper respiratory infection Persistent cough with hoarseness, nasal congestion, and drainage, likely due to a viral infection. Symptoms have improved since onset. No fever or chills. Fever blister suggests viral etiology. No need for antibiotics as symptoms are improving. - Continue supportive care with hydration and rest. - Consider honey for cough relief if needed.  Hyperlipidemia Managed with dietary changes. She has consulted a nutritionist and is cooking healthy meals. No current medication for hyperlipidemia. - Continue dietary modifications as advised by the nutritionist.  Hypertension Well-controlled with recent blood pressure readings around 128/68 to 128/75. - Continue current management and monitoring of blood pressure. Continue amlodipine  5 mg daily.    F/up as scheduled    Subjective:    Chief Complaint  Patient presents with   Cough    Pt has been dealing with persistent cough that has lead to hoarseness Sunday afternoon and worse Monday. Having nasal drainage which has stopped    HPI Discussed the use of AI scribe software for clinical note transcription with the patient, who gave verbal consent to proceed.  History of Present Illness Sabrina [name] is a 73 year old female who presents with a persistent cough and hoarseness.  She has been experiencing a persistent cough for approximately one week, leading to hoarseness. The cough is particularly bothersome when lying down, causing drainage that disrupts her sleep. She notes improvement in her symptoms, stating she slept well on Thursday night after having difficulty on Tuesday and Wednesday due to excessive coughing.  Initially, she had nasal congestion and drainage, which  has since resolved. She experienced a fever earlier in the week, which she managed with Abreva, and noticed some epistaxis when blowing her nose, which she addressed with saline rinses. No current fever or chills. She reports she has never had fever blisters.  She traveled to [place] with a friend and attended a family gathering, which she suspects may have contributed to her symptoms. She felt fine initially but began feeling tired and unwell by Sunday, with symptoms worsening thereafter. She has been taking Mucinex to help with mucus and reports drinking plenty of fluids, including immune-boosting drinks.  She has been actively managing her cholesterol through dietary changes, following advice from a nutritionist, and is proud of her improved eating habits. Her blood pressure has been stable, with recent readings around 128/68 to 128/75. Her daughter suggested that her recent dietary changes might have temporarily lowered her resistance, contributing to her current illness.  She is concerned about recovering fully before visiting her daughter's house for Thanksgiving, as she does not want to spread her illness. She has been using honey and tea to soothe her throat and is focused on staying hydrated and resting.     History reviewed. No pertinent past medical history.  Past Surgical History:  Procedure Laterality Date   CATARACT EXTRACTION Right 06/2024    Family History  Problem Relation Age of Onset   Hypertension Mother    Dementia Brother     Social History   Tobacco Use   Smoking status: Former   Smokeless tobacco: Never  Substance Use Topics   Alcohol use: Not Currently   Drug use: Never     No Known Allergies  Review of Systems NEGATIVE UNLESS OTHERWISE INDICATED IN HPI      Objective:     BP 136/72 (BP Location: Left Arm, Patient Position: Sitting, Cuff Size: Normal)   Pulse 75   Temp (!) 97.2 F (36.2 C) (Temporal)   Ht 5' 5 (1.651 m)   Wt 166 lb (75.3 kg)    SpO2 98%   BMI 27.62 kg/m   Wt Readings from Last 3 Encounters:  09/21/24 166 lb (75.3 kg)  09/05/24 165 lb 12.8 oz (75.2 kg)  06/20/24 164 lb (74.4 kg)    BP Readings from Last 3 Encounters:  09/21/24 136/72  09/05/24 (!) 164/88  10/27/23 (!) 162/75     Physical Exam Vitals and nursing note reviewed.  Constitutional:      General: She is not in acute distress.    Appearance: Normal appearance. She is not ill-appearing.  HENT:     Head: Normocephalic.     Right Ear: Tympanic membrane, ear canal and external ear normal.     Left Ear: Tympanic membrane, ear canal and external ear normal.     Nose: No congestion.     Mouth/Throat:     Mouth: Mucous membranes are moist.     Pharynx: No oropharyngeal exudate or posterior oropharyngeal erythema.  Eyes:     Extraocular Movements: Extraocular movements intact.     Conjunctiva/sclera: Conjunctivae normal.     Pupils: Pupils are equal, round, and reactive to light.  Cardiovascular:     Rate and Rhythm: Normal rate and regular rhythm.     Pulses: Normal pulses.     Heart sounds: Normal heart sounds. No murmur heard. Pulmonary:     Effort: Pulmonary effort is normal. No respiratory distress.     Breath sounds: Normal breath sounds. No wheezing.  Musculoskeletal:     Cervical back: Normal range of motion.  Skin:    General: Skin is warm.  Neurological:     Mental Status: She is alert and oriented to person, place, and time.  Psychiatric:        Mood and Affect: Mood normal.        Behavior: Behavior normal.             Jhonatan Lomeli M Azka Steger, PA-C

## 2024-10-11 ENCOUNTER — Ambulatory Visit: Admitting: Physician Assistant

## 2024-11-12 ENCOUNTER — Encounter: Payer: Self-pay | Admitting: Physician Assistant

## 2024-11-12 ENCOUNTER — Ambulatory Visit (INDEPENDENT_AMBULATORY_CARE_PROVIDER_SITE_OTHER): Admitting: Physician Assistant

## 2024-11-12 VITALS — BP 156/86 | HR 70 | Temp 97.6°F | Ht 65.0 in | Wt 165.6 lb

## 2024-11-12 DIAGNOSIS — E7800 Pure hypercholesterolemia, unspecified: Secondary | ICD-10-CM

## 2024-11-12 DIAGNOSIS — Z23 Encounter for immunization: Secondary | ICD-10-CM | POA: Diagnosis not present

## 2024-11-12 DIAGNOSIS — H6993 Unspecified Eustachian tube disorder, bilateral: Secondary | ICD-10-CM | POA: Diagnosis not present

## 2024-11-12 DIAGNOSIS — I10 Essential (primary) hypertension: Secondary | ICD-10-CM

## 2024-11-12 MED ORDER — ATORVASTATIN CALCIUM 10 MG PO TABS: 10.0000 mg | ORAL_TABLET | Freq: Every day | ORAL | 2 refills | Status: AC

## 2024-11-12 NOTE — Progress Notes (Signed)
 "   Patient ID: Sabrina [name], female    DOB: 1951/06/30, 74 y.o.   MRN: [id number]   Assessment & Plan:  Benign essential HTN  Elevated cholesterol -     CT CARDIAC SCORING ([facility] LOCATIONS ONLY); Future -     Atorvastatin  Calcium ; Take 1 tablet (10 mg total) by mouth at bedtime.  Dispense: 30 tablet; Refill: 2  Eustachian tube dysfunction, bilateral  Immunization due -     Pneumococcal conjugate vaccine 20-valent     Assessment & Plan Essential hypertension Blood pressure is elevated in the office at 158/84 mmHg, likely due to white coat syndrome. Home readings are generally within normal range, with occasional elevations during stress. Current medication is amlodipine  5 mg daily. - Continue amlodipine  5 mg daily. - Monitor blood pressure at home and report if consistently above 140/90 mmHg.  Hypercholesterolemia Elevated cholesterol with a 10-year cardiovascular risk score of 29.9%, indicating high risk for cardiovascular events. Discussed starting statin therapy to reduce cardiovascular risk. Explained potential side effects of statins, including muscle aches, and the use of vitamin D and CoQ10 to mitigate these effects. Discussed the option of a CT cardiac score to assess coronary artery plaque burden. - Started Lipitor 10 mg at bedtime. - Recommended vitamin D 1000-2000 IU daily and CoQ10 100 mg daily. - Ordered CT cardiac score to assess coronary artery plaque burden. - Will recheck cholesterol levels in a couple of months.  Eustachian tube dysfunction, bilateral Bilateral Eustachian tube dysfunction with symptoms of ear fullness and cloudiness in the ears, likely due to sinus congestion. Discussed the use of nasal saline and Flonase to alleviate symptoms. - Use nasal saline for sinus congestion. - Use Flonase nasal spray to reduce inflammation. - Consider using a humidifier to add moisture to the air.  General Health Maintenance Received Prevnar 20 vaccine  today. - Administered Prevnar 20 vaccine.   The 10-year ASCVD risk score (Arnett DK, et al., 2019) is: 29.4%   Values used to calculate the score:     Age: 22 years     Clinically relevant sex: Female     Is Non-Hispanic African American: Yes     Diabetic: No     Tobacco smoker: No     Systolic Blood Pressure: 156 mmHg     Is BP treated: Yes     HDL Cholesterol: 92.3 mg/dL     Total Cholesterol: 256 mg/dL    Return in about 3 months (around 02/10/2025) for recheck/follow-up, fasting labs .    Subjective:    Chief Complaint  Patient presents with   Hypertension    Pt in office for BP check and journal of BP readings form home; pt has changed diet and sleep changes; pt reports BP has been better; didn't bring paper with readings but know ranges to discuss;     Hypertension   Discussed the use of AI scribe software for clinical note transcription with the patient, who gave verbal consent to proceed.  History of Present Illness Sabrina [name] is a 74 year old female with hypertension who presents for a follow-up visit.  She is currently taking amlodipine  5 mg daily for hypertension. Her blood pressure in the office was 158/84 mmHg, while at home it averages around 134/70 mmHg, with occasional readings as low as 128/45 mmHg. Stressful situations, such as her brother's recent diagnosis of early dementia, have caused her blood pressure to rise. She is concerned about the impact of stress on her health.  She is worried about her elevated cholesterol levels and is considering medication to manage it. Her daughter has advised her to consider medication, and she is curious about potential side effects, particularly muscle aches.  She has recently made lifestyle changes, including dietary modifications and increased physical activity, such as going to the gym and preparing to start Pilates. She attended a cooking class to learn healthier cooking methods, although she found it  challenging to adapt.  She mentions a sensation of swelling in her throat and glands, describing it as feeling like she is 'trying to get something.' She has been avoiding contact with others to prevent illness. Her house is dry, and she is considering using a humidifier to alleviate dryness.  Her family history includes a brother with heart disease who has had a stent placed, and both a brother and an older sister with dementia. She expresses concern about her own health in light of her family's medical history.     History reviewed. No pertinent past medical history.  Past Surgical History:  Procedure Laterality Date   CATARACT EXTRACTION Right 06/2024    Family History  Problem Relation Age of Onset   Hypertension Mother    Dementia Brother     Social History[1]   Allergies[2]  Review of Systems NEGATIVE UNLESS OTHERWISE INDICATED IN HPI      Objective:     BP (!) 156/86 (BP Location: Left Arm, Patient Position: Sitting, Cuff Size: Normal) Comment (Cuff Size): Manual Reading  Pulse 70   Temp 97.6 F (36.4 C) (Temporal)   Ht 5' 5 (1.651 m)   Wt 165 lb 9.6 oz (75.1 kg)   SpO2 99%   BMI 27.56 kg/m   Wt Readings from Last 3 Encounters:  11/12/24 165 lb 9.6 oz (75.1 kg)  09/21/24 166 lb (75.3 kg)  09/05/24 165 lb 12.8 oz (75.2 kg)    BP Readings from Last 3 Encounters:  11/12/24 (!) 156/86  09/21/24 136/72  09/05/24 (!) 164/88     Physical Exam Vitals and nursing note reviewed.  Constitutional:      General: She is not in acute distress.    Appearance: Normal appearance. She is not ill-appearing.  HENT:     Head: Normocephalic.     Right Ear: External ear normal. A middle ear effusion is present.     Left Ear: External ear normal. A middle ear effusion is present.     Ears:     Comments: ETD noted on exam  Eyes:     Extraocular Movements: Extraocular movements intact.     Conjunctiva/sclera: Conjunctivae normal.     Pupils: Pupils are equal, round,  and reactive to light.  Cardiovascular:     Rate and Rhythm: Normal rate and regular rhythm.     Pulses: Normal pulses.     Heart sounds: Murmur heard.  Pulmonary:     Effort: Pulmonary effort is normal. No respiratory distress.     Breath sounds: Normal breath sounds. No wheezing.  Musculoskeletal:        General: Deformity (bilateral hands arthritis changes) present.     Cervical back: Normal range of motion.  Skin:    General: Skin is warm.  Neurological:     Mental Status: She is alert and oriented to person, place, and time.  Psychiatric:        Mood and Affect: Mood normal.        Behavior: Behavior normal.  Jessel Gettinger M Hyun Marsalis, PA-C    [1]  Social History Tobacco Use   Smoking status: Former   Smokeless tobacco: Never  Substance Use Topics   Alcohol use: Not Currently   Drug use: Never  [2] No Known Allergies  "

## 2024-11-12 NOTE — Patient Instructions (Signed)
" °  VISIT SUMMARY: Today, we discussed your blood pressure, cholesterol levels, and ear fullness. We also administered the Prevnar 20 vaccine.  YOUR PLAN: ESSENTIAL HYPERTENSION: Your blood pressure was elevated in the office, likely due to stress, but your home readings are generally normal. -Continue taking amlodipine  5 mg daily. -Monitor your blood pressure at home and report if it is consistently above 140/90 mmHg.  HYPERCHOLESTEROLEMIA: Your cholesterol levels are high, putting you at risk for cardiovascular events. We discussed starting medication to manage this. -Start taking Lipitor 10 mg at bedtime. -Take vitamin D 1000-2000 IU daily and CoQ10 100 mg daily to help mitigate potential side effects. -We ordered a CT cardiac score to assess your coronary artery plaque burden. -We will recheck your cholesterol levels in a couple of months.  EUSTACHIAN TUBE DYSFUNCTION, BILATERAL: You have symptoms of ear fullness and cloudiness, likely due to sinus congestion. -Use nasal saline for sinus congestion. -Use Flonase nasal spray to reduce inflammation. -Consider using a humidifier to add moisture to the air.  GENERAL HEALTH MAINTENANCE: You received the Prevnar 20 vaccine today. -Prevnar 20 vaccine administered.                      Contains text generated by [employer].                                 Contains text generated by [employer]bridge.   "

## 2024-11-20 NOTE — Progress Notes (Signed)
 Denine Brotz                                          MRN: [id number]   11/20/2024   The [facility] Quality Team Specialist reviewed this patient medical record for the purposes of chart review for care gap closure. The following were reviewed: chart review for care gap closure-colorectal cancer screening.    [facility] Quality Team

## 2025-06-24 ENCOUNTER — Ambulatory Visit
